# Patient Record
Sex: Female | Born: 1960
Health system: Southern US, Community
[De-identification: ages and names within clinical notes are randomized; demographics above are authoritative.]

## PROBLEM LIST (undated history)

## (undated) DIAGNOSIS — J3089 Other allergic rhinitis: Secondary | ICD-10-CM

## (undated) DIAGNOSIS — Z91018 Allergy to other foods: Secondary | ICD-10-CM

## (undated) DIAGNOSIS — F419 Anxiety disorder, unspecified: Secondary | ICD-10-CM

## (undated) DIAGNOSIS — R7303 Prediabetes: Secondary | ICD-10-CM

## (undated) DIAGNOSIS — I10 Essential (primary) hypertension: Secondary | ICD-10-CM

## (undated) DIAGNOSIS — J45909 Unspecified asthma, uncomplicated: Secondary | ICD-10-CM

## (undated) DIAGNOSIS — S4990XA Unspecified injury of shoulder and upper arm, unspecified arm, initial encounter: Secondary | ICD-10-CM

## (undated) DIAGNOSIS — R609 Edema, unspecified: Secondary | ICD-10-CM

## (undated) DIAGNOSIS — R030 Elevated blood-pressure reading, without diagnosis of hypertension: Secondary | ICD-10-CM

## (undated) DIAGNOSIS — Z9889 Other specified postprocedural states: Secondary | ICD-10-CM

## (undated) DIAGNOSIS — G43909 Migraine, unspecified, not intractable, without status migrainosus: Secondary | ICD-10-CM

## (undated) DIAGNOSIS — D164 Benign neoplasm of bones of skull and face: Secondary | ICD-10-CM

## (undated) DIAGNOSIS — M549 Dorsalgia, unspecified: Secondary | ICD-10-CM

## (undated) DIAGNOSIS — G4730 Sleep apnea, unspecified: Secondary | ICD-10-CM

## (undated) DIAGNOSIS — Z85828 Personal history of other malignant neoplasm of skin: Secondary | ICD-10-CM

## (undated) DIAGNOSIS — Z9109 Other allergy status, other than to drugs and biological substances: Secondary | ICD-10-CM

## (undated) DIAGNOSIS — Z860101 Personal history of adenomatous and serrated colon polyps: Secondary | ICD-10-CM

## (undated) DIAGNOSIS — J309 Allergic rhinitis, unspecified: Secondary | ICD-10-CM

## (undated) DIAGNOSIS — F329 Major depressive disorder, single episode, unspecified: Secondary | ICD-10-CM

## (undated) DIAGNOSIS — R0602 Shortness of breath: Secondary | ICD-10-CM

## (undated) DIAGNOSIS — R5383 Other fatigue: Secondary | ICD-10-CM

## (undated) DIAGNOSIS — J302 Other seasonal allergic rhinitis: Secondary | ICD-10-CM

## (undated) DIAGNOSIS — R112 Nausea with vomiting, unspecified: Secondary | ICD-10-CM

## (undated) DIAGNOSIS — F32A Depression, unspecified: Secondary | ICD-10-CM

## (undated) DIAGNOSIS — K219 Gastro-esophageal reflux disease without esophagitis: Secondary | ICD-10-CM

## (undated) DIAGNOSIS — Z86010 Personal history of colonic polyps: Secondary | ICD-10-CM

## (undated) HISTORY — DX: Unspecified asthma, uncomplicated: J45.909

## (undated) HISTORY — DX: Essential (primary) hypertension: I10

## (undated) HISTORY — DX: Shortness of breath: R06.02

## (undated) HISTORY — DX: Elevated blood-pressure reading, without diagnosis of hypertension: R03.0

## (undated) HISTORY — DX: Other seasonal allergic rhinitis: J30.2

## (undated) HISTORY — PX: ENDOMETRIAL ABLATION: SHX621

## (undated) HISTORY — DX: Depression, unspecified: F32.A

## (undated) HISTORY — DX: Other fatigue: R53.83

## (undated) HISTORY — DX: Gastro-esophageal reflux disease without esophagitis: K21.9

## (undated) HISTORY — PX: FOOT SURGERY: SHX648

## (undated) HISTORY — DX: Unspecified injury of shoulder and upper arm, unspecified arm, initial encounter: S49.90XA

## (undated) HISTORY — PX: OTHER SURGICAL HISTORY: SHX169

## (undated) HISTORY — DX: Anxiety disorder, unspecified: F41.9

## (undated) HISTORY — DX: Prediabetes: R73.03

## (undated) HISTORY — DX: Other allergy status, other than to drugs and biological substances: Z91.09

## (undated) HISTORY — DX: Sleep apnea, unspecified: G47.30

## (undated) HISTORY — DX: Allergy to other foods: Z91.018

## (undated) HISTORY — DX: Dorsalgia, unspecified: M54.9

## (undated) HISTORY — PX: HYSTEROSCOPY W/ ENDOMETRIAL ABLATION: SUR665

## (undated) HISTORY — DX: Edema, unspecified: R60.9

---

## 1998-04-24 ENCOUNTER — Ambulatory Visit (HOSPITAL_COMMUNITY): Admission: RE | Admit: 1998-04-24 | Discharge: 1998-04-24 | Payer: Self-pay | Admitting: *Deleted

## 1998-10-10 HISTORY — PX: CERVICAL CONIZATION W/BX: SHX1330

## 1999-05-05 ENCOUNTER — Other Ambulatory Visit: Admission: RE | Admit: 1999-05-05 | Discharge: 1999-05-05 | Payer: Self-pay | Admitting: *Deleted

## 1999-11-03 ENCOUNTER — Other Ambulatory Visit: Admission: RE | Admit: 1999-11-03 | Discharge: 1999-11-03 | Payer: Self-pay | Admitting: *Deleted

## 2000-06-26 ENCOUNTER — Other Ambulatory Visit: Admission: RE | Admit: 2000-06-26 | Discharge: 2000-06-26 | Payer: Self-pay | Admitting: *Deleted

## 2001-07-03 ENCOUNTER — Other Ambulatory Visit: Admission: RE | Admit: 2001-07-03 | Discharge: 2001-07-03 | Payer: Self-pay | Admitting: *Deleted

## 2002-07-11 ENCOUNTER — Other Ambulatory Visit: Admission: RE | Admit: 2002-07-11 | Discharge: 2002-07-11 | Payer: Self-pay | Admitting: Obstetrics and Gynecology

## 2003-08-13 ENCOUNTER — Other Ambulatory Visit: Admission: RE | Admit: 2003-08-13 | Discharge: 2003-08-13 | Payer: Self-pay | Admitting: *Deleted

## 2004-04-14 ENCOUNTER — Emergency Department (HOSPITAL_COMMUNITY): Admission: EM | Admit: 2004-04-14 | Discharge: 2004-04-14 | Payer: Self-pay | Admitting: Family Medicine

## 2004-08-16 ENCOUNTER — Other Ambulatory Visit: Admission: RE | Admit: 2004-08-16 | Discharge: 2004-08-16 | Payer: Self-pay | Admitting: Obstetrics and Gynecology

## 2005-08-25 ENCOUNTER — Other Ambulatory Visit: Admission: RE | Admit: 2005-08-25 | Discharge: 2005-08-25 | Payer: Self-pay | Admitting: Obstetrics and Gynecology

## 2013-08-16 ENCOUNTER — Other Ambulatory Visit: Payer: Self-pay | Admitting: Allergy and Immunology

## 2013-08-16 ENCOUNTER — Ambulatory Visit
Admission: RE | Admit: 2013-08-16 | Discharge: 2013-08-16 | Disposition: A | Discharge status: Home / Self Care | Payer: 59 | Source: Ambulatory Visit | Attending: Allergy and Immunology | Admitting: Allergy and Immunology

## 2013-08-16 DIAGNOSIS — J45909 Unspecified asthma, uncomplicated: Secondary | ICD-10-CM

## 2016-10-11 DIAGNOSIS — Z01419 Encounter for gynecological examination (general) (routine) without abnormal findings: Secondary | ICD-10-CM | POA: Diagnosis not present

## 2016-10-13 DIAGNOSIS — L820 Inflamed seborrheic keratosis: Secondary | ICD-10-CM | POA: Diagnosis not present

## 2016-10-13 DIAGNOSIS — J301 Allergic rhinitis due to pollen: Secondary | ICD-10-CM | POA: Diagnosis not present

## 2016-10-13 DIAGNOSIS — L821 Other seborrheic keratosis: Secondary | ICD-10-CM | POA: Diagnosis not present

## 2016-10-13 DIAGNOSIS — Z86018 Personal history of other benign neoplasm: Secondary | ICD-10-CM | POA: Diagnosis not present

## 2016-10-13 DIAGNOSIS — J3081 Allergic rhinitis due to animal (cat) (dog) hair and dander: Secondary | ICD-10-CM | POA: Diagnosis not present

## 2016-10-13 DIAGNOSIS — J3089 Other allergic rhinitis: Secondary | ICD-10-CM | POA: Diagnosis not present

## 2016-10-13 DIAGNOSIS — Z85828 Personal history of other malignant neoplasm of skin: Secondary | ICD-10-CM | POA: Diagnosis not present

## 2016-10-18 DIAGNOSIS — J3089 Other allergic rhinitis: Secondary | ICD-10-CM | POA: Diagnosis not present

## 2016-10-18 DIAGNOSIS — J3081 Allergic rhinitis due to animal (cat) (dog) hair and dander: Secondary | ICD-10-CM | POA: Diagnosis not present

## 2016-10-18 DIAGNOSIS — J301 Allergic rhinitis due to pollen: Secondary | ICD-10-CM | POA: Diagnosis not present

## 2016-10-21 DIAGNOSIS — J3081 Allergic rhinitis due to animal (cat) (dog) hair and dander: Secondary | ICD-10-CM | POA: Diagnosis not present

## 2016-10-21 DIAGNOSIS — J3089 Other allergic rhinitis: Secondary | ICD-10-CM | POA: Diagnosis not present

## 2016-10-21 DIAGNOSIS — J301 Allergic rhinitis due to pollen: Secondary | ICD-10-CM | POA: Diagnosis not present

## 2016-10-31 DIAGNOSIS — J301 Allergic rhinitis due to pollen: Secondary | ICD-10-CM | POA: Diagnosis not present

## 2016-10-31 DIAGNOSIS — J3089 Other allergic rhinitis: Secondary | ICD-10-CM | POA: Diagnosis not present

## 2016-10-31 DIAGNOSIS — J3081 Allergic rhinitis due to animal (cat) (dog) hair and dander: Secondary | ICD-10-CM | POA: Diagnosis not present

## 2016-11-09 DIAGNOSIS — J3081 Allergic rhinitis due to animal (cat) (dog) hair and dander: Secondary | ICD-10-CM | POA: Diagnosis not present

## 2016-11-09 DIAGNOSIS — J301 Allergic rhinitis due to pollen: Secondary | ICD-10-CM | POA: Diagnosis not present

## 2016-11-09 DIAGNOSIS — J3089 Other allergic rhinitis: Secondary | ICD-10-CM | POA: Diagnosis not present

## 2016-11-14 DIAGNOSIS — M9903 Segmental and somatic dysfunction of lumbar region: Secondary | ICD-10-CM | POA: Diagnosis not present

## 2016-11-14 DIAGNOSIS — M545 Low back pain: Secondary | ICD-10-CM | POA: Diagnosis not present

## 2016-11-14 DIAGNOSIS — M542 Cervicalgia: Secondary | ICD-10-CM | POA: Diagnosis not present

## 2016-11-16 DIAGNOSIS — M542 Cervicalgia: Secondary | ICD-10-CM | POA: Diagnosis not present

## 2016-11-16 DIAGNOSIS — M545 Low back pain: Secondary | ICD-10-CM | POA: Diagnosis not present

## 2016-11-16 DIAGNOSIS — M9903 Segmental and somatic dysfunction of lumbar region: Secondary | ICD-10-CM | POA: Diagnosis not present

## 2016-11-23 DIAGNOSIS — J3081 Allergic rhinitis due to animal (cat) (dog) hair and dander: Secondary | ICD-10-CM | POA: Diagnosis not present

## 2016-11-23 DIAGNOSIS — J301 Allergic rhinitis due to pollen: Secondary | ICD-10-CM | POA: Diagnosis not present

## 2016-11-23 DIAGNOSIS — J3089 Other allergic rhinitis: Secondary | ICD-10-CM | POA: Diagnosis not present

## 2016-11-29 DIAGNOSIS — J3081 Allergic rhinitis due to animal (cat) (dog) hair and dander: Secondary | ICD-10-CM | POA: Diagnosis not present

## 2016-11-29 DIAGNOSIS — J301 Allergic rhinitis due to pollen: Secondary | ICD-10-CM | POA: Diagnosis not present

## 2016-11-29 DIAGNOSIS — J3089 Other allergic rhinitis: Secondary | ICD-10-CM | POA: Diagnosis not present

## 2016-12-07 DIAGNOSIS — J301 Allergic rhinitis due to pollen: Secondary | ICD-10-CM | POA: Diagnosis not present

## 2016-12-07 DIAGNOSIS — J3089 Other allergic rhinitis: Secondary | ICD-10-CM | POA: Diagnosis not present

## 2016-12-07 DIAGNOSIS — J3081 Allergic rhinitis due to animal (cat) (dog) hair and dander: Secondary | ICD-10-CM | POA: Diagnosis not present

## 2016-12-12 DIAGNOSIS — J3089 Other allergic rhinitis: Secondary | ICD-10-CM | POA: Diagnosis not present

## 2016-12-14 DIAGNOSIS — J301 Allergic rhinitis due to pollen: Secondary | ICD-10-CM | POA: Diagnosis not present

## 2016-12-14 DIAGNOSIS — J3081 Allergic rhinitis due to animal (cat) (dog) hair and dander: Secondary | ICD-10-CM | POA: Diagnosis not present

## 2016-12-14 DIAGNOSIS — J3089 Other allergic rhinitis: Secondary | ICD-10-CM | POA: Diagnosis not present

## 2016-12-21 DIAGNOSIS — J3081 Allergic rhinitis due to animal (cat) (dog) hair and dander: Secondary | ICD-10-CM | POA: Diagnosis not present

## 2016-12-21 DIAGNOSIS — J3089 Other allergic rhinitis: Secondary | ICD-10-CM | POA: Diagnosis not present

## 2016-12-21 DIAGNOSIS — J301 Allergic rhinitis due to pollen: Secondary | ICD-10-CM | POA: Diagnosis not present

## 2016-12-30 DIAGNOSIS — J3081 Allergic rhinitis due to animal (cat) (dog) hair and dander: Secondary | ICD-10-CM | POA: Diagnosis not present

## 2016-12-30 DIAGNOSIS — J3089 Other allergic rhinitis: Secondary | ICD-10-CM | POA: Diagnosis not present

## 2016-12-30 DIAGNOSIS — J301 Allergic rhinitis due to pollen: Secondary | ICD-10-CM | POA: Diagnosis not present

## 2017-01-04 DIAGNOSIS — J301 Allergic rhinitis due to pollen: Secondary | ICD-10-CM | POA: Diagnosis not present

## 2017-01-04 DIAGNOSIS — J3081 Allergic rhinitis due to animal (cat) (dog) hair and dander: Secondary | ICD-10-CM | POA: Diagnosis not present

## 2017-01-05 DIAGNOSIS — J3081 Allergic rhinitis due to animal (cat) (dog) hair and dander: Secondary | ICD-10-CM | POA: Diagnosis not present

## 2017-01-05 DIAGNOSIS — J3089 Other allergic rhinitis: Secondary | ICD-10-CM | POA: Diagnosis not present

## 2017-01-05 DIAGNOSIS — J301 Allergic rhinitis due to pollen: Secondary | ICD-10-CM | POA: Diagnosis not present

## 2017-01-11 DIAGNOSIS — J301 Allergic rhinitis due to pollen: Secondary | ICD-10-CM | POA: Diagnosis not present

## 2017-01-11 DIAGNOSIS — J3081 Allergic rhinitis due to animal (cat) (dog) hair and dander: Secondary | ICD-10-CM | POA: Diagnosis not present

## 2017-01-11 DIAGNOSIS — J3089 Other allergic rhinitis: Secondary | ICD-10-CM | POA: Diagnosis not present

## 2017-01-17 DIAGNOSIS — J301 Allergic rhinitis due to pollen: Secondary | ICD-10-CM | POA: Diagnosis not present

## 2017-01-17 DIAGNOSIS — J3089 Other allergic rhinitis: Secondary | ICD-10-CM | POA: Diagnosis not present

## 2017-01-17 DIAGNOSIS — J3081 Allergic rhinitis due to animal (cat) (dog) hair and dander: Secondary | ICD-10-CM | POA: Diagnosis not present

## 2017-01-27 DIAGNOSIS — J301 Allergic rhinitis due to pollen: Secondary | ICD-10-CM | POA: Diagnosis not present

## 2017-01-27 DIAGNOSIS — J3081 Allergic rhinitis due to animal (cat) (dog) hair and dander: Secondary | ICD-10-CM | POA: Diagnosis not present

## 2017-01-27 DIAGNOSIS — J3089 Other allergic rhinitis: Secondary | ICD-10-CM | POA: Diagnosis not present

## 2017-02-01 DIAGNOSIS — J3081 Allergic rhinitis due to animal (cat) (dog) hair and dander: Secondary | ICD-10-CM | POA: Diagnosis not present

## 2017-02-01 DIAGNOSIS — J301 Allergic rhinitis due to pollen: Secondary | ICD-10-CM | POA: Diagnosis not present

## 2017-02-01 DIAGNOSIS — J3089 Other allergic rhinitis: Secondary | ICD-10-CM | POA: Diagnosis not present

## 2017-02-06 DIAGNOSIS — Z1231 Encounter for screening mammogram for malignant neoplasm of breast: Secondary | ICD-10-CM | POA: Diagnosis not present

## 2017-02-08 DIAGNOSIS — J3089 Other allergic rhinitis: Secondary | ICD-10-CM | POA: Diagnosis not present

## 2017-02-08 DIAGNOSIS — J3081 Allergic rhinitis due to animal (cat) (dog) hair and dander: Secondary | ICD-10-CM | POA: Diagnosis not present

## 2017-02-08 DIAGNOSIS — J301 Allergic rhinitis due to pollen: Secondary | ICD-10-CM | POA: Diagnosis not present

## 2017-02-17 DIAGNOSIS — J301 Allergic rhinitis due to pollen: Secondary | ICD-10-CM | POA: Diagnosis not present

## 2017-02-17 DIAGNOSIS — J3081 Allergic rhinitis due to animal (cat) (dog) hair and dander: Secondary | ICD-10-CM | POA: Diagnosis not present

## 2017-02-17 DIAGNOSIS — J3089 Other allergic rhinitis: Secondary | ICD-10-CM | POA: Diagnosis not present

## 2017-02-24 DIAGNOSIS — J3089 Other allergic rhinitis: Secondary | ICD-10-CM | POA: Diagnosis not present

## 2017-02-24 DIAGNOSIS — J301 Allergic rhinitis due to pollen: Secondary | ICD-10-CM | POA: Diagnosis not present

## 2017-02-24 DIAGNOSIS — J3081 Allergic rhinitis due to animal (cat) (dog) hair and dander: Secondary | ICD-10-CM | POA: Diagnosis not present

## 2017-02-28 DIAGNOSIS — M1612 Unilateral primary osteoarthritis, left hip: Secondary | ICD-10-CM | POA: Diagnosis not present

## 2017-02-28 DIAGNOSIS — Z01812 Encounter for preprocedural laboratory examination: Secondary | ICD-10-CM | POA: Diagnosis not present

## 2017-02-28 DIAGNOSIS — Z01818 Encounter for other preprocedural examination: Secondary | ICD-10-CM | POA: Diagnosis not present

## 2017-03-01 DIAGNOSIS — J3089 Other allergic rhinitis: Secondary | ICD-10-CM | POA: Diagnosis not present

## 2017-03-01 DIAGNOSIS — J301 Allergic rhinitis due to pollen: Secondary | ICD-10-CM | POA: Diagnosis not present

## 2017-03-01 DIAGNOSIS — J3081 Allergic rhinitis due to animal (cat) (dog) hair and dander: Secondary | ICD-10-CM | POA: Diagnosis not present

## 2017-03-09 DIAGNOSIS — J3081 Allergic rhinitis due to animal (cat) (dog) hair and dander: Secondary | ICD-10-CM | POA: Diagnosis not present

## 2017-03-09 DIAGNOSIS — J3089 Other allergic rhinitis: Secondary | ICD-10-CM | POA: Diagnosis not present

## 2017-03-09 DIAGNOSIS — J301 Allergic rhinitis due to pollen: Secondary | ICD-10-CM | POA: Diagnosis not present

## 2017-03-13 DIAGNOSIS — M546 Pain in thoracic spine: Secondary | ICD-10-CM | POA: Diagnosis not present

## 2017-03-13 DIAGNOSIS — M542 Cervicalgia: Secondary | ICD-10-CM | POA: Diagnosis not present

## 2017-03-13 DIAGNOSIS — M545 Low back pain: Secondary | ICD-10-CM | POA: Diagnosis not present

## 2017-03-15 DIAGNOSIS — J301 Allergic rhinitis due to pollen: Secondary | ICD-10-CM | POA: Diagnosis not present

## 2017-03-15 DIAGNOSIS — J3081 Allergic rhinitis due to animal (cat) (dog) hair and dander: Secondary | ICD-10-CM | POA: Diagnosis not present

## 2017-03-15 DIAGNOSIS — J3089 Other allergic rhinitis: Secondary | ICD-10-CM | POA: Diagnosis not present

## 2017-03-21 DIAGNOSIS — J3089 Other allergic rhinitis: Secondary | ICD-10-CM | POA: Diagnosis not present

## 2017-03-21 DIAGNOSIS — J3081 Allergic rhinitis due to animal (cat) (dog) hair and dander: Secondary | ICD-10-CM | POA: Diagnosis not present

## 2017-03-21 DIAGNOSIS — J301 Allergic rhinitis due to pollen: Secondary | ICD-10-CM | POA: Diagnosis not present

## 2017-03-28 DIAGNOSIS — J3089 Other allergic rhinitis: Secondary | ICD-10-CM | POA: Diagnosis not present

## 2017-03-28 DIAGNOSIS — J301 Allergic rhinitis due to pollen: Secondary | ICD-10-CM | POA: Diagnosis not present

## 2017-04-03 DIAGNOSIS — J301 Allergic rhinitis due to pollen: Secondary | ICD-10-CM | POA: Diagnosis not present

## 2017-04-03 DIAGNOSIS — J3081 Allergic rhinitis due to animal (cat) (dog) hair and dander: Secondary | ICD-10-CM | POA: Diagnosis not present

## 2017-04-03 DIAGNOSIS — J3089 Other allergic rhinitis: Secondary | ICD-10-CM | POA: Diagnosis not present

## 2017-04-06 DIAGNOSIS — M546 Pain in thoracic spine: Secondary | ICD-10-CM | POA: Diagnosis not present

## 2017-04-06 DIAGNOSIS — M542 Cervicalgia: Secondary | ICD-10-CM | POA: Diagnosis not present

## 2017-04-06 DIAGNOSIS — M545 Low back pain: Secondary | ICD-10-CM | POA: Diagnosis not present

## 2017-04-11 DIAGNOSIS — J3081 Allergic rhinitis due to animal (cat) (dog) hair and dander: Secondary | ICD-10-CM | POA: Diagnosis not present

## 2017-04-11 DIAGNOSIS — J3089 Other allergic rhinitis: Secondary | ICD-10-CM | POA: Diagnosis not present

## 2017-04-11 DIAGNOSIS — J301 Allergic rhinitis due to pollen: Secondary | ICD-10-CM | POA: Diagnosis not present

## 2017-04-20 DIAGNOSIS — J301 Allergic rhinitis due to pollen: Secondary | ICD-10-CM | POA: Diagnosis not present

## 2017-04-20 DIAGNOSIS — J3081 Allergic rhinitis due to animal (cat) (dog) hair and dander: Secondary | ICD-10-CM | POA: Diagnosis not present

## 2017-04-20 DIAGNOSIS — J3089 Other allergic rhinitis: Secondary | ICD-10-CM | POA: Diagnosis not present

## 2017-04-25 DIAGNOSIS — M542 Cervicalgia: Secondary | ICD-10-CM | POA: Diagnosis not present

## 2017-04-25 DIAGNOSIS — M546 Pain in thoracic spine: Secondary | ICD-10-CM | POA: Diagnosis not present

## 2017-04-25 DIAGNOSIS — M545 Low back pain: Secondary | ICD-10-CM | POA: Diagnosis not present

## 2017-04-27 DIAGNOSIS — J3081 Allergic rhinitis due to animal (cat) (dog) hair and dander: Secondary | ICD-10-CM | POA: Diagnosis not present

## 2017-04-27 DIAGNOSIS — J3089 Other allergic rhinitis: Secondary | ICD-10-CM | POA: Diagnosis not present

## 2017-04-27 DIAGNOSIS — J301 Allergic rhinitis due to pollen: Secondary | ICD-10-CM | POA: Diagnosis not present

## 2017-05-01 DIAGNOSIS — M546 Pain in thoracic spine: Secondary | ICD-10-CM | POA: Diagnosis not present

## 2017-05-01 DIAGNOSIS — M545 Low back pain: Secondary | ICD-10-CM | POA: Diagnosis not present

## 2017-05-01 DIAGNOSIS — M542 Cervicalgia: Secondary | ICD-10-CM | POA: Diagnosis not present

## 2017-05-03 DIAGNOSIS — J3081 Allergic rhinitis due to animal (cat) (dog) hair and dander: Secondary | ICD-10-CM | POA: Diagnosis not present

## 2017-05-03 DIAGNOSIS — J3089 Other allergic rhinitis: Secondary | ICD-10-CM | POA: Diagnosis not present

## 2017-05-03 DIAGNOSIS — J301 Allergic rhinitis due to pollen: Secondary | ICD-10-CM | POA: Diagnosis not present

## 2017-05-10 DIAGNOSIS — J3089 Other allergic rhinitis: Secondary | ICD-10-CM | POA: Diagnosis not present

## 2017-05-10 DIAGNOSIS — J301 Allergic rhinitis due to pollen: Secondary | ICD-10-CM | POA: Diagnosis not present

## 2017-05-10 DIAGNOSIS — J3081 Allergic rhinitis due to animal (cat) (dog) hair and dander: Secondary | ICD-10-CM | POA: Diagnosis not present

## 2017-05-17 DIAGNOSIS — J301 Allergic rhinitis due to pollen: Secondary | ICD-10-CM | POA: Diagnosis not present

## 2017-05-17 DIAGNOSIS — J3089 Other allergic rhinitis: Secondary | ICD-10-CM | POA: Diagnosis not present

## 2017-05-17 DIAGNOSIS — J3081 Allergic rhinitis due to animal (cat) (dog) hair and dander: Secondary | ICD-10-CM | POA: Diagnosis not present

## 2017-05-23 DIAGNOSIS — J3089 Other allergic rhinitis: Secondary | ICD-10-CM | POA: Diagnosis not present

## 2017-05-23 DIAGNOSIS — J301 Allergic rhinitis due to pollen: Secondary | ICD-10-CM | POA: Diagnosis not present

## 2017-05-23 DIAGNOSIS — J3081 Allergic rhinitis due to animal (cat) (dog) hair and dander: Secondary | ICD-10-CM | POA: Diagnosis not present

## 2017-05-26 DIAGNOSIS — J3089 Other allergic rhinitis: Secondary | ICD-10-CM | POA: Diagnosis not present

## 2017-05-30 DIAGNOSIS — J3089 Other allergic rhinitis: Secondary | ICD-10-CM | POA: Diagnosis not present

## 2017-05-30 DIAGNOSIS — J301 Allergic rhinitis due to pollen: Secondary | ICD-10-CM | POA: Diagnosis not present

## 2017-05-30 DIAGNOSIS — J3081 Allergic rhinitis due to animal (cat) (dog) hair and dander: Secondary | ICD-10-CM | POA: Diagnosis not present

## 2017-06-07 DIAGNOSIS — J301 Allergic rhinitis due to pollen: Secondary | ICD-10-CM | POA: Diagnosis not present

## 2017-06-07 DIAGNOSIS — J3089 Other allergic rhinitis: Secondary | ICD-10-CM | POA: Diagnosis not present

## 2017-06-07 DIAGNOSIS — M542 Cervicalgia: Secondary | ICD-10-CM | POA: Diagnosis not present

## 2017-06-07 DIAGNOSIS — M546 Pain in thoracic spine: Secondary | ICD-10-CM | POA: Diagnosis not present

## 2017-06-07 DIAGNOSIS — J3081 Allergic rhinitis due to animal (cat) (dog) hair and dander: Secondary | ICD-10-CM | POA: Diagnosis not present

## 2017-06-07 DIAGNOSIS — M545 Low back pain: Secondary | ICD-10-CM | POA: Diagnosis not present

## 2017-06-14 DIAGNOSIS — J3089 Other allergic rhinitis: Secondary | ICD-10-CM | POA: Diagnosis not present

## 2017-06-14 DIAGNOSIS — J3081 Allergic rhinitis due to animal (cat) (dog) hair and dander: Secondary | ICD-10-CM | POA: Diagnosis not present

## 2017-06-14 DIAGNOSIS — J301 Allergic rhinitis due to pollen: Secondary | ICD-10-CM | POA: Diagnosis not present

## 2017-06-16 DIAGNOSIS — Z23 Encounter for immunization: Secondary | ICD-10-CM | POA: Diagnosis not present

## 2017-06-26 DIAGNOSIS — J3089 Other allergic rhinitis: Secondary | ICD-10-CM | POA: Diagnosis not present

## 2017-06-26 DIAGNOSIS — J301 Allergic rhinitis due to pollen: Secondary | ICD-10-CM | POA: Diagnosis not present

## 2017-06-26 DIAGNOSIS — J3081 Allergic rhinitis due to animal (cat) (dog) hair and dander: Secondary | ICD-10-CM | POA: Diagnosis not present

## 2017-06-29 DIAGNOSIS — J301 Allergic rhinitis due to pollen: Secondary | ICD-10-CM | POA: Diagnosis not present

## 2017-06-29 DIAGNOSIS — J3081 Allergic rhinitis due to animal (cat) (dog) hair and dander: Secondary | ICD-10-CM | POA: Diagnosis not present

## 2017-07-03 DIAGNOSIS — M546 Pain in thoracic spine: Secondary | ICD-10-CM | POA: Diagnosis not present

## 2017-07-03 DIAGNOSIS — M545 Low back pain: Secondary | ICD-10-CM | POA: Diagnosis not present

## 2017-07-03 DIAGNOSIS — M542 Cervicalgia: Secondary | ICD-10-CM | POA: Diagnosis not present

## 2017-07-04 DIAGNOSIS — J3081 Allergic rhinitis due to animal (cat) (dog) hair and dander: Secondary | ICD-10-CM | POA: Diagnosis not present

## 2017-07-04 DIAGNOSIS — J3089 Other allergic rhinitis: Secondary | ICD-10-CM | POA: Diagnosis not present

## 2017-07-04 DIAGNOSIS — J301 Allergic rhinitis due to pollen: Secondary | ICD-10-CM | POA: Diagnosis not present

## 2017-07-04 DIAGNOSIS — Z23 Encounter for immunization: Secondary | ICD-10-CM | POA: Diagnosis not present

## 2017-07-13 DIAGNOSIS — J301 Allergic rhinitis due to pollen: Secondary | ICD-10-CM | POA: Diagnosis not present

## 2017-07-13 DIAGNOSIS — J3081 Allergic rhinitis due to animal (cat) (dog) hair and dander: Secondary | ICD-10-CM | POA: Diagnosis not present

## 2017-07-13 DIAGNOSIS — J3089 Other allergic rhinitis: Secondary | ICD-10-CM | POA: Diagnosis not present

## 2017-07-28 DIAGNOSIS — J301 Allergic rhinitis due to pollen: Secondary | ICD-10-CM | POA: Diagnosis not present

## 2017-07-28 DIAGNOSIS — J3081 Allergic rhinitis due to animal (cat) (dog) hair and dander: Secondary | ICD-10-CM | POA: Diagnosis not present

## 2017-07-28 DIAGNOSIS — J3089 Other allergic rhinitis: Secondary | ICD-10-CM | POA: Diagnosis not present

## 2017-08-01 DIAGNOSIS — J301 Allergic rhinitis due to pollen: Secondary | ICD-10-CM | POA: Diagnosis not present

## 2017-08-01 DIAGNOSIS — J3089 Other allergic rhinitis: Secondary | ICD-10-CM | POA: Diagnosis not present

## 2017-08-01 DIAGNOSIS — J3081 Allergic rhinitis due to animal (cat) (dog) hair and dander: Secondary | ICD-10-CM | POA: Diagnosis not present

## 2017-08-04 DIAGNOSIS — J3089 Other allergic rhinitis: Secondary | ICD-10-CM | POA: Diagnosis not present

## 2017-08-04 DIAGNOSIS — J3081 Allergic rhinitis due to animal (cat) (dog) hair and dander: Secondary | ICD-10-CM | POA: Diagnosis not present

## 2017-08-04 DIAGNOSIS — J301 Allergic rhinitis due to pollen: Secondary | ICD-10-CM | POA: Diagnosis not present

## 2017-08-07 DIAGNOSIS — M9901 Segmental and somatic dysfunction of cervical region: Secondary | ICD-10-CM | POA: Diagnosis not present

## 2017-08-07 DIAGNOSIS — M546 Pain in thoracic spine: Secondary | ICD-10-CM | POA: Diagnosis not present

## 2017-08-07 DIAGNOSIS — M542 Cervicalgia: Secondary | ICD-10-CM | POA: Diagnosis not present

## 2017-08-08 DIAGNOSIS — J3089 Other allergic rhinitis: Secondary | ICD-10-CM | POA: Diagnosis not present

## 2017-08-08 DIAGNOSIS — J301 Allergic rhinitis due to pollen: Secondary | ICD-10-CM | POA: Diagnosis not present

## 2017-08-08 DIAGNOSIS — J3081 Allergic rhinitis due to animal (cat) (dog) hair and dander: Secondary | ICD-10-CM | POA: Diagnosis not present

## 2017-08-11 DIAGNOSIS — J301 Allergic rhinitis due to pollen: Secondary | ICD-10-CM | POA: Diagnosis not present

## 2017-08-11 DIAGNOSIS — J3081 Allergic rhinitis due to animal (cat) (dog) hair and dander: Secondary | ICD-10-CM | POA: Diagnosis not present

## 2017-08-11 DIAGNOSIS — J3089 Other allergic rhinitis: Secondary | ICD-10-CM | POA: Diagnosis not present

## 2017-08-16 DIAGNOSIS — J301 Allergic rhinitis due to pollen: Secondary | ICD-10-CM | POA: Diagnosis not present

## 2017-08-16 DIAGNOSIS — J3089 Other allergic rhinitis: Secondary | ICD-10-CM | POA: Diagnosis not present

## 2017-08-16 DIAGNOSIS — J3081 Allergic rhinitis due to animal (cat) (dog) hair and dander: Secondary | ICD-10-CM | POA: Diagnosis not present

## 2017-08-22 DIAGNOSIS — I10 Essential (primary) hypertension: Secondary | ICD-10-CM | POA: Diagnosis not present

## 2017-08-22 DIAGNOSIS — R7303 Prediabetes: Secondary | ICD-10-CM | POA: Diagnosis not present

## 2017-08-23 DIAGNOSIS — Z0100 Encounter for examination of eyes and vision without abnormal findings: Secondary | ICD-10-CM | POA: Diagnosis not present

## 2017-08-23 DIAGNOSIS — J301 Allergic rhinitis due to pollen: Secondary | ICD-10-CM | POA: Diagnosis not present

## 2017-08-23 DIAGNOSIS — J3089 Other allergic rhinitis: Secondary | ICD-10-CM | POA: Diagnosis not present

## 2017-08-23 DIAGNOSIS — J3081 Allergic rhinitis due to animal (cat) (dog) hair and dander: Secondary | ICD-10-CM | POA: Diagnosis not present

## 2017-08-29 DIAGNOSIS — J301 Allergic rhinitis due to pollen: Secondary | ICD-10-CM | POA: Diagnosis not present

## 2017-08-29 DIAGNOSIS — J3089 Other allergic rhinitis: Secondary | ICD-10-CM | POA: Diagnosis not present

## 2017-08-29 DIAGNOSIS — J3081 Allergic rhinitis due to animal (cat) (dog) hair and dander: Secondary | ICD-10-CM | POA: Diagnosis not present

## 2017-09-06 DIAGNOSIS — J301 Allergic rhinitis due to pollen: Secondary | ICD-10-CM | POA: Diagnosis not present

## 2017-09-06 DIAGNOSIS — J3081 Allergic rhinitis due to animal (cat) (dog) hair and dander: Secondary | ICD-10-CM | POA: Diagnosis not present

## 2017-09-06 DIAGNOSIS — J3089 Other allergic rhinitis: Secondary | ICD-10-CM | POA: Diagnosis not present

## 2017-09-12 DIAGNOSIS — J3081 Allergic rhinitis due to animal (cat) (dog) hair and dander: Secondary | ICD-10-CM | POA: Diagnosis not present

## 2017-09-12 DIAGNOSIS — J3089 Other allergic rhinitis: Secondary | ICD-10-CM | POA: Diagnosis not present

## 2017-09-12 DIAGNOSIS — J301 Allergic rhinitis due to pollen: Secondary | ICD-10-CM | POA: Diagnosis not present

## 2017-09-20 DIAGNOSIS — J301 Allergic rhinitis due to pollen: Secondary | ICD-10-CM | POA: Diagnosis not present

## 2017-09-20 DIAGNOSIS — J3081 Allergic rhinitis due to animal (cat) (dog) hair and dander: Secondary | ICD-10-CM | POA: Diagnosis not present

## 2017-09-20 DIAGNOSIS — J3089 Other allergic rhinitis: Secondary | ICD-10-CM | POA: Diagnosis not present

## 2017-09-29 DIAGNOSIS — J3081 Allergic rhinitis due to animal (cat) (dog) hair and dander: Secondary | ICD-10-CM | POA: Diagnosis not present

## 2017-09-29 DIAGNOSIS — J301 Allergic rhinitis due to pollen: Secondary | ICD-10-CM | POA: Diagnosis not present

## 2017-09-29 DIAGNOSIS — J3089 Other allergic rhinitis: Secondary | ICD-10-CM | POA: Diagnosis not present

## 2017-10-05 DIAGNOSIS — J3089 Other allergic rhinitis: Secondary | ICD-10-CM | POA: Diagnosis not present

## 2017-10-05 DIAGNOSIS — J3081 Allergic rhinitis due to animal (cat) (dog) hair and dander: Secondary | ICD-10-CM | POA: Diagnosis not present

## 2017-10-05 DIAGNOSIS — J301 Allergic rhinitis due to pollen: Secondary | ICD-10-CM | POA: Diagnosis not present

## 2017-10-10 HISTORY — PX: FOOT SURGERY: SHX648

## 2017-10-11 DIAGNOSIS — Z86018 Personal history of other benign neoplasm: Secondary | ICD-10-CM | POA: Diagnosis not present

## 2017-10-11 DIAGNOSIS — L820 Inflamed seborrheic keratosis: Secondary | ICD-10-CM | POA: Diagnosis not present

## 2017-10-11 DIAGNOSIS — Z85828 Personal history of other malignant neoplasm of skin: Secondary | ICD-10-CM | POA: Diagnosis not present

## 2017-10-13 DIAGNOSIS — J301 Allergic rhinitis due to pollen: Secondary | ICD-10-CM | POA: Diagnosis not present

## 2017-10-13 DIAGNOSIS — J3081 Allergic rhinitis due to animal (cat) (dog) hair and dander: Secondary | ICD-10-CM | POA: Diagnosis not present

## 2017-10-13 DIAGNOSIS — J3089 Other allergic rhinitis: Secondary | ICD-10-CM | POA: Diagnosis not present

## 2017-10-18 DIAGNOSIS — J3081 Allergic rhinitis due to animal (cat) (dog) hair and dander: Secondary | ICD-10-CM | POA: Diagnosis not present

## 2017-10-18 DIAGNOSIS — J3089 Other allergic rhinitis: Secondary | ICD-10-CM | POA: Diagnosis not present

## 2017-10-18 DIAGNOSIS — J301 Allergic rhinitis due to pollen: Secondary | ICD-10-CM | POA: Diagnosis not present

## 2017-10-23 DIAGNOSIS — J3081 Allergic rhinitis due to animal (cat) (dog) hair and dander: Secondary | ICD-10-CM | POA: Diagnosis not present

## 2017-10-23 DIAGNOSIS — J3089 Other allergic rhinitis: Secondary | ICD-10-CM | POA: Diagnosis not present

## 2017-10-23 DIAGNOSIS — J301 Allergic rhinitis due to pollen: Secondary | ICD-10-CM | POA: Diagnosis not present

## 2017-10-31 DIAGNOSIS — J3089 Other allergic rhinitis: Secondary | ICD-10-CM | POA: Diagnosis not present

## 2017-10-31 DIAGNOSIS — J301 Allergic rhinitis due to pollen: Secondary | ICD-10-CM | POA: Diagnosis not present

## 2017-11-10 DIAGNOSIS — J301 Allergic rhinitis due to pollen: Secondary | ICD-10-CM | POA: Diagnosis not present

## 2017-11-10 DIAGNOSIS — J4540 Moderate persistent asthma, uncomplicated: Secondary | ICD-10-CM | POA: Diagnosis not present

## 2017-11-10 DIAGNOSIS — J3081 Allergic rhinitis due to animal (cat) (dog) hair and dander: Secondary | ICD-10-CM | POA: Diagnosis not present

## 2017-11-10 DIAGNOSIS — J3089 Other allergic rhinitis: Secondary | ICD-10-CM | POA: Diagnosis not present

## 2017-11-17 DIAGNOSIS — J3089 Other allergic rhinitis: Secondary | ICD-10-CM | POA: Diagnosis not present

## 2017-11-17 DIAGNOSIS — J3081 Allergic rhinitis due to animal (cat) (dog) hair and dander: Secondary | ICD-10-CM | POA: Diagnosis not present

## 2017-11-17 DIAGNOSIS — J301 Allergic rhinitis due to pollen: Secondary | ICD-10-CM | POA: Diagnosis not present

## 2017-11-22 DIAGNOSIS — J3089 Other allergic rhinitis: Secondary | ICD-10-CM | POA: Diagnosis not present

## 2017-11-22 DIAGNOSIS — J3081 Allergic rhinitis due to animal (cat) (dog) hair and dander: Secondary | ICD-10-CM | POA: Diagnosis not present

## 2017-11-22 DIAGNOSIS — J301 Allergic rhinitis due to pollen: Secondary | ICD-10-CM | POA: Diagnosis not present

## 2017-11-27 DIAGNOSIS — J301 Allergic rhinitis due to pollen: Secondary | ICD-10-CM | POA: Diagnosis not present

## 2017-11-27 DIAGNOSIS — J3081 Allergic rhinitis due to animal (cat) (dog) hair and dander: Secondary | ICD-10-CM | POA: Diagnosis not present

## 2017-11-28 DIAGNOSIS — J301 Allergic rhinitis due to pollen: Secondary | ICD-10-CM | POA: Diagnosis not present

## 2017-11-28 DIAGNOSIS — J3081 Allergic rhinitis due to animal (cat) (dog) hair and dander: Secondary | ICD-10-CM | POA: Diagnosis not present

## 2017-11-28 DIAGNOSIS — J3089 Other allergic rhinitis: Secondary | ICD-10-CM | POA: Diagnosis not present

## 2017-12-06 DIAGNOSIS — J301 Allergic rhinitis due to pollen: Secondary | ICD-10-CM | POA: Diagnosis not present

## 2017-12-06 DIAGNOSIS — J3089 Other allergic rhinitis: Secondary | ICD-10-CM | POA: Diagnosis not present

## 2017-12-06 DIAGNOSIS — J3081 Allergic rhinitis due to animal (cat) (dog) hair and dander: Secondary | ICD-10-CM | POA: Diagnosis not present

## 2017-12-13 DIAGNOSIS — J3089 Other allergic rhinitis: Secondary | ICD-10-CM | POA: Diagnosis not present

## 2017-12-13 DIAGNOSIS — J301 Allergic rhinitis due to pollen: Secondary | ICD-10-CM | POA: Diagnosis not present

## 2017-12-13 DIAGNOSIS — J3081 Allergic rhinitis due to animal (cat) (dog) hair and dander: Secondary | ICD-10-CM | POA: Diagnosis not present

## 2017-12-18 DIAGNOSIS — J301 Allergic rhinitis due to pollen: Secondary | ICD-10-CM | POA: Diagnosis not present

## 2017-12-18 DIAGNOSIS — J3081 Allergic rhinitis due to animal (cat) (dog) hair and dander: Secondary | ICD-10-CM | POA: Diagnosis not present

## 2017-12-18 DIAGNOSIS — J3089 Other allergic rhinitis: Secondary | ICD-10-CM | POA: Diagnosis not present

## 2017-12-21 DIAGNOSIS — J3081 Allergic rhinitis due to animal (cat) (dog) hair and dander: Secondary | ICD-10-CM | POA: Diagnosis not present

## 2017-12-21 DIAGNOSIS — J3089 Other allergic rhinitis: Secondary | ICD-10-CM | POA: Diagnosis not present

## 2017-12-21 DIAGNOSIS — J301 Allergic rhinitis due to pollen: Secondary | ICD-10-CM | POA: Diagnosis not present

## 2017-12-27 DIAGNOSIS — J3081 Allergic rhinitis due to animal (cat) (dog) hair and dander: Secondary | ICD-10-CM | POA: Diagnosis not present

## 2017-12-27 DIAGNOSIS — J301 Allergic rhinitis due to pollen: Secondary | ICD-10-CM | POA: Diagnosis not present

## 2017-12-27 DIAGNOSIS — Z6835 Body mass index (BMI) 35.0-35.9, adult: Secondary | ICD-10-CM | POA: Diagnosis not present

## 2017-12-27 DIAGNOSIS — J3089 Other allergic rhinitis: Secondary | ICD-10-CM | POA: Diagnosis not present

## 2017-12-27 DIAGNOSIS — Z01419 Encounter for gynecological examination (general) (routine) without abnormal findings: Secondary | ICD-10-CM | POA: Diagnosis not present

## 2018-01-09 DIAGNOSIS — J3089 Other allergic rhinitis: Secondary | ICD-10-CM | POA: Diagnosis not present

## 2018-01-09 DIAGNOSIS — J3081 Allergic rhinitis due to animal (cat) (dog) hair and dander: Secondary | ICD-10-CM | POA: Diagnosis not present

## 2018-01-09 DIAGNOSIS — J301 Allergic rhinitis due to pollen: Secondary | ICD-10-CM | POA: Diagnosis not present

## 2018-01-12 DIAGNOSIS — J301 Allergic rhinitis due to pollen: Secondary | ICD-10-CM | POA: Diagnosis not present

## 2018-01-12 DIAGNOSIS — J3081 Allergic rhinitis due to animal (cat) (dog) hair and dander: Secondary | ICD-10-CM | POA: Diagnosis not present

## 2018-01-12 DIAGNOSIS — J3089 Other allergic rhinitis: Secondary | ICD-10-CM | POA: Diagnosis not present

## 2018-01-15 DIAGNOSIS — J3089 Other allergic rhinitis: Secondary | ICD-10-CM | POA: Diagnosis not present

## 2018-01-15 DIAGNOSIS — J3081 Allergic rhinitis due to animal (cat) (dog) hair and dander: Secondary | ICD-10-CM | POA: Diagnosis not present

## 2018-01-15 DIAGNOSIS — J301 Allergic rhinitis due to pollen: Secondary | ICD-10-CM | POA: Diagnosis not present

## 2018-01-16 DIAGNOSIS — R87618 Other abnormal cytological findings on specimens from cervix uteri: Secondary | ICD-10-CM | POA: Diagnosis not present

## 2018-01-19 DIAGNOSIS — J3081 Allergic rhinitis due to animal (cat) (dog) hair and dander: Secondary | ICD-10-CM | POA: Diagnosis not present

## 2018-01-19 DIAGNOSIS — J301 Allergic rhinitis due to pollen: Secondary | ICD-10-CM | POA: Diagnosis not present

## 2018-01-24 DIAGNOSIS — J301 Allergic rhinitis due to pollen: Secondary | ICD-10-CM | POA: Diagnosis not present

## 2018-01-24 DIAGNOSIS — J3081 Allergic rhinitis due to animal (cat) (dog) hair and dander: Secondary | ICD-10-CM | POA: Diagnosis not present

## 2018-01-24 DIAGNOSIS — J3089 Other allergic rhinitis: Secondary | ICD-10-CM | POA: Diagnosis not present

## 2018-01-31 DIAGNOSIS — J301 Allergic rhinitis due to pollen: Secondary | ICD-10-CM | POA: Diagnosis not present

## 2018-01-31 DIAGNOSIS — J3089 Other allergic rhinitis: Secondary | ICD-10-CM | POA: Diagnosis not present

## 2018-01-31 DIAGNOSIS — J3081 Allergic rhinitis due to animal (cat) (dog) hair and dander: Secondary | ICD-10-CM | POA: Diagnosis not present

## 2018-02-05 DIAGNOSIS — J3081 Allergic rhinitis due to animal (cat) (dog) hair and dander: Secondary | ICD-10-CM | POA: Diagnosis not present

## 2018-02-05 DIAGNOSIS — J301 Allergic rhinitis due to pollen: Secondary | ICD-10-CM | POA: Diagnosis not present

## 2018-02-05 DIAGNOSIS — J3089 Other allergic rhinitis: Secondary | ICD-10-CM | POA: Diagnosis not present

## 2018-02-09 DIAGNOSIS — Z1231 Encounter for screening mammogram for malignant neoplasm of breast: Secondary | ICD-10-CM | POA: Diagnosis not present

## 2018-02-12 ENCOUNTER — Ambulatory Visit (INDEPENDENT_AMBULATORY_CARE_PROVIDER_SITE_OTHER): Payer: 59

## 2018-02-12 ENCOUNTER — Ambulatory Visit (INDEPENDENT_AMBULATORY_CARE_PROVIDER_SITE_OTHER): Payer: 59 | Admitting: Podiatry

## 2018-02-12 ENCOUNTER — Encounter: Payer: Self-pay | Admitting: Podiatry

## 2018-02-12 DIAGNOSIS — M7741 Metatarsalgia, right foot: Secondary | ICD-10-CM

## 2018-02-12 DIAGNOSIS — M21619 Bunion of unspecified foot: Secondary | ICD-10-CM

## 2018-02-12 DIAGNOSIS — M779 Enthesopathy, unspecified: Secondary | ICD-10-CM

## 2018-02-12 DIAGNOSIS — J3081 Allergic rhinitis due to animal (cat) (dog) hair and dander: Secondary | ICD-10-CM | POA: Diagnosis not present

## 2018-02-12 DIAGNOSIS — J3089 Other allergic rhinitis: Secondary | ICD-10-CM | POA: Diagnosis not present

## 2018-02-12 DIAGNOSIS — M205X1 Other deformities of toe(s) (acquired), right foot: Secondary | ICD-10-CM | POA: Diagnosis not present

## 2018-02-12 DIAGNOSIS — J301 Allergic rhinitis due to pollen: Secondary | ICD-10-CM | POA: Diagnosis not present

## 2018-02-12 NOTE — Patient Instructions (Signed)
Pre-Operative Instructions  Congratulations, you have decided to take an important step towards improving your quality of life.  You can be assured that the doctors and staff at [facility] will be with you every step of the way.  Here are some important things you should know:  1. Plan to be at the surgery center/hospital at least 1 (one) hour prior to your scheduled time, unless otherwise directed by the surgical center/hospital staff.  You must have a responsible adult accompany you, remain during the surgery and drive you home.  Make sure you have directions to the surgical center/hospital to ensure you arrive on time. 2. If you are having surgery at [facility] or [facility], you will need a copy of your medical history and physical form from your family physician within one month prior to the date of surgery. We will give you a form for your primary physician to complete.  3. We make every effort to accommodate the date you request for surgery.  However, there are times where surgery dates or times have to be moved.  We will contact you as soon as possible if a change in schedule is required.   4. No aspirin/ibuprofen for one week before surgery.  If you are on aspirin, any non-steroidal anti-inflammatory medications (Mobic, Aleve, Ibuprofen) should not be taken seven (7) days prior to your surgery.  You make take Tylenol for pain prior to surgery.  5. Medications - If you are taking daily heart and blood pressure medications, seizure, reflux, allergy, asthma, anxiety, pain or diabetes medications, make sure you notify the surgery center/hospital before the day of surgery so they can tell you which medications you should take or avoid the day of surgery. 6. No food or drink after midnight the night before surgery unless directed otherwise by surgical center/hospital staff. 7. No alcoholic beverages 24-hours prior to surgery.  No smoking 24-hours prior or 24-hours after  surgery. 8. Wear loose pants or shorts. They should be loose enough to fit over bandages, boots, and casts. 9. Don't wear slip-on shoes. Sneakers are preferred. 10. Bring your boot with you to the surgery center/hospital.  Also bring crutches or a walker if your physician has prescribed it for you.  If you do not have this equipment, it will be provided for you after surgery. 11. If you have not been contacted by the surgery center/hospital by the day before your surgery, call to confirm the date and time of your surgery. 12. Leave-time from work may vary depending on the type of surgery you have.  Appropriate arrangements should be made prior to surgery with your employer. 13. Prescriptions will be provided immediately following surgery by your doctor.  Fill these as soon as possible after surgery and take the medication as directed. Pain medications will not be refilled on weekends and must be approved by the doctor. 14. Remove nail polish on the operative foot and avoid getting pedicures prior to surgery. 15. Wash the night before surgery.  The night before surgery wash the foot and leg well with water and the antibacterial soap provided. Be sure to pay special attention to beneath the toenails and in between the toes.  Wash for at least three (3) minutes. Rinse thoroughly with water and dry well with a towel.  Perform this wash unless told not to do so by your physician.  Enclosed: 1 Ice pack (please put in freezer the night before surgery)   1 Hibiclens skin cleaner     Pre-op instructions  If you have any questions regarding the instructions, please do not hesitate to call our office.  [place]: [address], [place], [place] [address] -- [phone]  [place]: [address] [place], [place] [address] -- [phone]  Home: [address]  [place], [place] [address] -- [phone]  [place]: [address], [address], [place], [place] [address] -- [phone]  Website:  [email or web address] 

## 2018-02-12 NOTE — Progress Notes (Signed)
Subjective:    Patient ID: Heather Morales, female    DOB: Aug 05, 1961, 57 y.o.   MRN: [id number]  HPI 57 year old female presents the office today for concerns of a painful bump on the inside of her right big toe.  She states that it hurts to walk and she cannot wear heels.  She has had 2 previous surgeries to this area about 10 years ago.  She denies any recent injury or trauma.  She also gets quite a bit of pain she points to submetatarsal 2.  She has tried changing shoes and offloading with a significant improvement.  She denies any numbness or tingling.  She has no other concerns today.   Review of Systems  All other systems reviewed and are negative.  History reviewed. No pertinent past medical history.  History reviewed. No pertinent surgical history.   Current Outpatient Medications:  .  fluticasone (FLONASE) 50 MCG/ACT nasal spray, Place into both nostrils daily., Disp: , Rfl:  .  Lansoprazole (PREVACID PO), Take by mouth., Disp: , Rfl:  .  levocetirizine (XYZAL) 5 MG tablet, Take 5 mg by mouth every evening., Disp: , Rfl:  .  montelukast (SINGULAIR) 10 MG tablet, Take 10 mg by mouth at bedtime., Disp: , Rfl:  .  Vitamin D, Ergocalciferol, 2000 units CAPS, Take by mouth., Disp: , Rfl:  .  buPROPion (WELLBUTRIN XL) 300 MG 24 hr tablet, TAKE 1 TABLET BY MOUTH EVERY DAY IN THE MORNING, Disp: , Rfl: 1 .  progesterone (PROMETRIUM) 100 MG capsule, TAKE 1 CAPSULE BY MOUTH EVERY DAY IN THE EVENING AT BEDTIME, Disp: , Rfl: 11  Allergies  Allergen Reactions  . Penicillins     Social History   Socioeconomic History  . Marital status: Married    Spouse name: Not on file  . Number of children: Not on file  . Years of education: Not on file  . Highest education level: Not on file  Occupational History  . Not on file  Social Needs  . Financial resource strain: Not on file  . Food insecurity:    Worry: Not on file    Inability: Not on file  . Transportation needs:    Medical:  Not on file    Non-medical: Not on file  Tobacco Use  . Smoking status: Unknown If Ever Smoked  . Smokeless tobacco: Never Used  Substance and Sexual Activity  . Alcohol use: Not on file  . Drug use: Not on file  . Sexual activity: Not on file  Lifestyle  . Physical activity:    Days per week: Not on file    Minutes per session: Not on file  . Stress: Not on file  Relationships  . Social connections:    Talks on phone: Not on file    Gets together: Not on file    Attends religious service: Not on file    Active member of club or organization: Not on file    Attends meetings of clubs or organizations: Not on file    Relationship status: Not on file  . Intimate partner violence:    Fear of current or ex partner: Not on file    Emotionally abused: Not on file    Physically abused: Not on file    Forced sexual activity: Not on file  Other Topics Concern  . Not on file  Social History Narrative  . Not on file       Objective:   Physical Exam  General: AAO x3, NAD  Dermatological: There is mild erythema to the medial first metatarsal head on the dorsal medial aspect from a bony prominence causing irritation set issues but there is no skin breakdown identified today.  There is no open lesions or pre-ulcerative lesions identified.  Vascular: Dorsalis Pedis artery and Posterior Tibial artery pedal pulses are 2/4 bilateral with immedate capillary fill time. There is no pain with calf compression, swelling, warmth, erythema.   Neruologic: Grossly intact via light touch bilateral.  Protective threshold with Semmes Wienstein monofilament intact to all pedal sites bilateral.   Musculoskeletal: There is decreased range of motion of the first MTPJ on the right side.  There is tenderness submetatarsal 2 on the right foot as well.  There is no first ray hypomobility present.  There is tenderness palpation along the dorsal medial prominence of the first metatarsal.  There is no other area  tenderness identified.  Muscular strength 5/5 in all groups tested bilateral.  Gait: Unassisted, Nonantalgic.       Assessment & Plan:  57 year old female with right hallux limitus, capsulitis second metatarsal arthritic changes present the first MPJ -Treatment options discussed including all alternatives, risks, and complications -Etiology of symptoms were discussed -X-rays were obtained and reviewed with the patient..  Also elongated second metatarsal.  There is no evidence of acute fracture identified. -We did discussion regards to both conservative as well as surgical treatment options.  After discussion she like to proceed with surgical intervention.  Discussed various surgical options for her and after we discussed them in detail she elected to proceed with a Cartiva implant for the 1st MTPJ as well as removal of the bone spurs from the first MPJ, second metatarsal shortening osteotomy. We discussed this is not a guarantee of resolution of symptoms and that this implant is a new implant we do not have long-term success rates.  Ultimately she may need to have another surgery for imaging fusion she understands this. -The incision placement as well as the postoperative course was discussed with the patient. I discussed risks of the surgery which include, but not limited to, infection, bleeding, pain, swelling, need for further surgery, delayed or nonhealing, painful or ugly scar, numbness or sensation changes, over/under correction, recurrence, transfer lesions, further deformity, hardware failure, DVT/PE, loss of toe/foot. Patient understands these risks and wishes to proceed with surgery. The surgical consent was reviewed with the patient all 3 pages were signed. No promises or guarantees were given to the outcome of the procedure. All questions were answered to the [place] of my ability. Before the surgery the patient was encouraged to call the office if there is any further questions. The surgery will  be performed at the [facility] on an outpatient basis. -Cam boot dispensed for postoperative use  Trula Slade DPM

## 2018-02-19 DIAGNOSIS — J3089 Other allergic rhinitis: Secondary | ICD-10-CM | POA: Diagnosis not present

## 2018-02-19 DIAGNOSIS — J3081 Allergic rhinitis due to animal (cat) (dog) hair and dander: Secondary | ICD-10-CM | POA: Diagnosis not present

## 2018-02-19 DIAGNOSIS — J301 Allergic rhinitis due to pollen: Secondary | ICD-10-CM | POA: Diagnosis not present

## 2018-02-26 ENCOUNTER — Telehealth: Payer: Self-pay | Admitting: *Deleted

## 2018-02-26 NOTE — Telephone Encounter (Signed)
"  We're playing phone tag.  Thanks so much."

## 2018-02-26 NOTE — Telephone Encounter (Signed)
"  I'm returning your call.  How can I help you?  "Are you the one I speak to about my cost for the surgery?"  You need to talk to Jocelyn Lamer in our insurance department.  I will let her know.  She'll do your calculations and let you know.  However, you need to call the surgical center and ask about their fees as well as anesthesia.  You need to let anesthesia know how long your surgery will take.  Your surgery will take 1.5 hours.  "Okay I have to have FMLA papers filled out.  Who do I give those to in your office?"  FMLA papers go to [employer].  "How long will I have to be out of work?  I can sit on my job."  You can go back after a week as long as you can sit and elevate.   "Okay, that's what I thought and that's what I had planned.  Thank you so much for answering my questions."

## 2018-02-26 NOTE — Telephone Encounter (Signed)
"  I have surgery scheduled for the 29th, a week from Wednesday.  I just wanted to touch base.  I have a few questions."  I attempted to call her back.  I left her a message to call me back on her home phone number.  The message on her work number said not to leave a message.

## 2018-02-27 DIAGNOSIS — J3089 Other allergic rhinitis: Secondary | ICD-10-CM | POA: Diagnosis not present

## 2018-02-27 DIAGNOSIS — J301 Allergic rhinitis due to pollen: Secondary | ICD-10-CM | POA: Diagnosis not present

## 2018-02-27 DIAGNOSIS — J3081 Allergic rhinitis due to animal (cat) (dog) hair and dander: Secondary | ICD-10-CM | POA: Diagnosis not present

## 2018-03-02 DIAGNOSIS — Z780 Asymptomatic menopausal state: Secondary | ICD-10-CM | POA: Diagnosis not present

## 2018-03-02 DIAGNOSIS — R9389 Abnormal findings on diagnostic imaging of other specified body structures: Secondary | ICD-10-CM | POA: Diagnosis not present

## 2018-03-07 ENCOUNTER — Encounter: Payer: Self-pay | Admitting: Podiatry

## 2018-03-07 ENCOUNTER — Other Ambulatory Visit: Payer: Self-pay | Admitting: Podiatry

## 2018-03-07 DIAGNOSIS — M7751 Other enthesopathy of right foot: Secondary | ICD-10-CM | POA: Diagnosis not present

## 2018-03-07 DIAGNOSIS — M25571 Pain in right ankle and joints of right foot: Secondary | ICD-10-CM | POA: Diagnosis not present

## 2018-03-07 DIAGNOSIS — M779 Enthesopathy, unspecified: Secondary | ICD-10-CM | POA: Diagnosis not present

## 2018-03-07 DIAGNOSIS — M2021 Hallux rigidus, right foot: Secondary | ICD-10-CM | POA: Diagnosis not present

## 2018-03-07 DIAGNOSIS — M205X1 Other deformities of toe(s) (acquired), right foot: Secondary | ICD-10-CM | POA: Diagnosis not present

## 2018-03-07 DIAGNOSIS — M7731 Calcaneal spur, right foot: Secondary | ICD-10-CM | POA: Diagnosis not present

## 2018-03-07 MED ORDER — OXYCODONE-ACETAMINOPHEN 5-325 MG PO TABS: 1.0000 | ORAL_TABLET | ORAL | 0 refills | Status: DC | PRN

## 2018-03-07 MED ORDER — PROMETHAZINE HCL 25 MG PO TABS
25.0000 mg | ORAL_TABLET | Freq: Three times a day (TID) | ORAL | 0 refills | Status: DC | PRN
Start: 2018-03-07 — End: 2018-10-31

## 2018-03-07 MED ORDER — CLINDAMYCIN HCL 300 MG PO CAPS: 300.0000 mg | ORAL_CAPSULE | Freq: Three times a day (TID) | ORAL | 2 refills | Status: DC

## 2018-03-07 NOTE — Progress Notes (Signed)
Postop medications sent including clindamycin, percocet, phenergan

## 2018-03-12 ENCOUNTER — Encounter: Payer: Self-pay | Admitting: Podiatry

## 2018-03-12 ENCOUNTER — Telehealth: Payer: Self-pay | Admitting: *Deleted

## 2018-03-12 ENCOUNTER — Ambulatory Visit (INDEPENDENT_AMBULATORY_CARE_PROVIDER_SITE_OTHER): Payer: 59 | Admitting: Podiatry

## 2018-03-12 ENCOUNTER — Ambulatory Visit (INDEPENDENT_AMBULATORY_CARE_PROVIDER_SITE_OTHER): Payer: 59

## 2018-03-12 VITALS — BP 124/71 | HR 84 | Temp 96.2°F | Resp 18

## 2018-03-12 DIAGNOSIS — Z9889 Other specified postprocedural states: Secondary | ICD-10-CM

## 2018-03-12 DIAGNOSIS — M779 Enthesopathy, unspecified: Secondary | ICD-10-CM

## 2018-03-12 DIAGNOSIS — M205X1 Other deformities of toe(s) (acquired), right foot: Secondary | ICD-10-CM

## 2018-03-12 MED ORDER — HYDROCODONE-ACETAMINOPHEN 5-325 MG PO TABS
1.0000 | ORAL_TABLET | ORAL | 0 refills | Status: AC | PRN
Start: 2018-03-12 — End: ?

## 2018-03-12 NOTE — Telephone Encounter (Signed)
Called and patient is doing ok and there is no fever or chills or nausea and is using the ice and elevating and there is some tingling in the toes and does have some pain medicine to take if needed. Lattie Haw

## 2018-03-12 NOTE — Progress Notes (Signed)
Subjective: Heather Morales is a 57 y.o. is seen today in office s/p right foot Cartiva implant and second metatarsal shortening osteotomy preformed on 03/07/2018 and she states her pain has been improving.  She states that the pain medicine is making her sick however.  She has been in the cam boot she is internal ice and elevate.  She denies any recent falls or injury.  Denies any systemic complaints such as fevers, chills, nausea, vomiting. No calf pain, chest pain, shortness of breath.   Objective: General: No acute distress, AAOx3  DP/PT pulses palpable 2/4, CRT < 3 sec to all digits.  Protective sensation intact. Motor function intact.  RIGHT foot: Incision is well coapted without any evidence of dehiscence and sutures are intact. There is no surrounding erythema, ascending cellulitis, fluctuance, crepitus, malodor, drainage/purulence. There is mild  edema around the surgical site. There is mild pain along the surgical site.  There is no pain with MPJ range of motion. No other areas of tenderness to bilateral lower extremities.  No other open lesions or pre-ulcerative lesions.  No pain with calf compression, swelling, warmth, erythema.   Assessment and Plan:  Status post right foot surgery, doing well with no complications   -Treatment options discussed including all alternatives, risks, and complications -X-rays were obtained and reviewed.  No evidence of acute infection.  Hardware intact. -Antibiotic ointment and a bandage was applied.  Keep the dressing clean, dry, intact. -Cam boot at all times.  Weightbearing as tolerated. -Ice/elevation -Pain medication as needed. -Monitor for any clinical signs or symptoms of infection and DVT/PE and directed to call the office immediately should any occur or go to the ER. -Follow-up in 1 week or sooner if any problems arise. In the meantime, encouraged to call the office with any questions, concerns, change in symptoms.   Celesta Gentile, DPM

## 2018-03-14 ENCOUNTER — Encounter: Payer: Self-pay | Admitting: Podiatry

## 2018-03-19 ENCOUNTER — Encounter: Payer: Self-pay | Admitting: Podiatry

## 2018-03-19 ENCOUNTER — Ambulatory Visit (INDEPENDENT_AMBULATORY_CARE_PROVIDER_SITE_OTHER): Payer: 59 | Admitting: Podiatry

## 2018-03-19 VITALS — Temp 97.6°F

## 2018-03-19 DIAGNOSIS — M205X1 Other deformities of toe(s) (acquired), right foot: Secondary | ICD-10-CM

## 2018-03-19 DIAGNOSIS — M779 Enthesopathy, unspecified: Secondary | ICD-10-CM

## 2018-03-20 NOTE — Progress Notes (Signed)
Subjective: Heather Morales is a 57 y.o. is seen today in office s/p right foot Cartiva implant and second metatarsal shortening osteotomy preformed on 03/07/2018.  She states overall she is doing better.  She still has some swelling that she is noticed today when the bandage was removed but otherwise she has been doing well.  She has not been taking any pain medication recently.  Denies any recent injury or falls. Denies any systemic complaints such as fevers, chills, nausea, vomiting. No calf pain, chest pain, shortness of breath.   Objective: General: No acute distress, AAOx3  DP/PT pulses palpable 2/4, CRT < 3 sec to all digits.  Protective sensation intact. Motor function intact.  RIGHT foot: Incision is well coapted without any evidence of dehiscence and sutures are intact. There is no surrounding erythema, ascending cellulitis, fluctuance, crepitus, malodor, drainage/purulence. There is mild but improved edema around the surgical site. There is minimal to no pain along the surgical site.  There is no pain with MPJ range of motion. No other areas of tenderness to bilateral lower extremities.  No other open lesions or pre-ulcerative lesions.  No pain with calf compression, swelling, warmth, erythema.   Assessment and Plan:  Status post right foot surgery, doing well with no complications   -Treatment options discussed including all alternatives, risks, and complications -Still some motion across the incisions therefore left the sutures intact.  Antibiotic ointment and a dressing was applied.  Keep the dressing clean, dry, intact otherwise she can change it with a similar dressing every couple days if needed.  Hold off on getting the area wet. -Continue cam boot at all times.  She will be weightbearing as tolerated. -Cam boot at all times.  Weightbearing as tolerated. -Ice/elevation -Pain medication as needed. -She is going to be going back to work tomorrow but she can get something to drive  her and she is a sitting job for the most part.  Encouraged her to keep ice on the area she can as well as keeping the foot elevated. -Monitor for any clinical signs or symptoms of infection and DVT/PE and directed to call the office immediately should any occur or go to the ER. -Follow-up in as scheduled for suture removal sooner if any problems arise. In the meantime, encouraged to call the office with any questions, concerns, change in symptoms.   Celesta Gentile, DPM

## 2018-03-26 ENCOUNTER — Encounter: Payer: Self-pay | Admitting: Podiatry

## 2018-03-26 ENCOUNTER — Ambulatory Visit (INDEPENDENT_AMBULATORY_CARE_PROVIDER_SITE_OTHER): Payer: 59 | Admitting: Podiatry

## 2018-03-26 VITALS — Temp 98.6°F

## 2018-03-26 DIAGNOSIS — Z9889 Other specified postprocedural states: Secondary | ICD-10-CM

## 2018-03-26 DIAGNOSIS — M779 Enthesopathy, unspecified: Secondary | ICD-10-CM

## 2018-03-26 DIAGNOSIS — M205X1 Other deformities of toe(s) (acquired), right foot: Secondary | ICD-10-CM

## 2018-03-26 MED ORDER — CLINDAMYCIN HCL 300 MG PO CAPS: 300.0000 mg | ORAL_CAPSULE | Freq: Three times a day (TID) | ORAL | 2 refills | Status: DC

## 2018-03-26 MED ORDER — CLINDAMYCIN HCL 300 MG PO CAPS: 300.0000 mg | ORAL_CAPSULE | Freq: Three times a day (TID) | ORAL | 0 refills | Status: DC

## 2018-03-27 NOTE — Progress Notes (Signed)
Subjective: Heather Morales is a 57 y.o. is seen today in office s/p right foot Cartiva implant and second metatarsal shortening osteotomy preformed on 03/07/2018.  She presents today for suture removal.  She states that she has been on her feet more than turning her back to work.  She has remained in the cam boot.  Overall she feels well she denies any fevers, chills, nausea, vomiting.  Denies any calf pain, chest pain, shortness of breath. Denies any recent injury or falls. Denies any systemic complaints such as fevers, chills, nausea, vomiting. No calf pain, chest pain, shortness of breath.   Objective: General: No acute distress, AAOx3  DP/PT pulses palpable 2/4, CRT < 3 sec to all digits.  Protective sensation intact. Motor function intact.  RIGHT foot: Incision is well coapted without any evidence of dehiscence and sutures are intact.  There is faint surrounding erythema along the incision on the first MTPJ but there is no area of fluctuation or crepitation.  There is no significant increase in warmth of this area compared to the other areas of the foot and there is no ascending cellulitis.  There is no malodor.  There is no drainage or pus coming from the incision and the incisions remain well coapted.  I think some of this erythema although faint is coming more from inflammation as opposed to infection.  The toes are in rectus position.  There is no pain with MPJ range of motion.  No significant discomfort to palpation the surgical site itself. No other open lesions or pre-ulcerative lesions.  No pain with calf compression, swelling, warmth, erythema.   Assessment and Plan:  Status post right foot surgery, doing well with no complications   -Treatment options discussed including all alternatives, risks, and complications -To remove the sutures were any complications.  Incision remains well coapted.  I applied Steri-Strips for reinforcement followed by antibiotic ointment and a bandage.  There is  some faint erythema although again this is more from inflammation related to the antibiotic for the next week precaution.  This was sent to her pharmacy today.  There is on to Wednesday and then she can start to wash the foot with soap and water and apply antibiotic ointment and a bandage.  Remaining at all times.  Ice elevation.  Pain medication as needed however she has not been requiring this. -Monitor for any clinical signs or symptoms of infection and directed to call the office immediately should any occur or go to the ER. -RTC 1 week or sooner if needed  Trula Slade DPM

## 2018-04-02 ENCOUNTER — Encounter: Payer: Self-pay | Admitting: Podiatry

## 2018-04-02 ENCOUNTER — Ambulatory Visit (INDEPENDENT_AMBULATORY_CARE_PROVIDER_SITE_OTHER): Payer: 59 | Admitting: Podiatry

## 2018-04-02 ENCOUNTER — Ambulatory Visit (INDEPENDENT_AMBULATORY_CARE_PROVIDER_SITE_OTHER): Payer: 59

## 2018-04-02 DIAGNOSIS — M779 Enthesopathy, unspecified: Secondary | ICD-10-CM

## 2018-04-02 DIAGNOSIS — M205X1 Other deformities of toe(s) (acquired), right foot: Secondary | ICD-10-CM

## 2018-04-05 NOTE — Progress Notes (Signed)
Subjective: Heather Morales is a 57 y.o. is seen today in office s/p right foot Cartiva implant and second metatarsal shortening osteotomy preformed on 03/07/2018. She states that she is doing well. She states that she did not take the antibiotic as she felt that she did not need it. She has been wearing the surgical boot.  She states that she is doing well and her pain is much improved.  She denies any recent injury or fall. Denies any systemic complaints such as fevers, chills, nausea, vomiting. No calf pain, chest pain, shortness of breath.   Objective: General: No acute distress, AAOx3  DP/PT pulses palpable 2/4, CRT < 3 sec to all digits.  Protective sensation intact. Motor function intact.  RIGHT foot: Incision is well coapted without any evidence of dehiscence and scars are formed.  A small amount of scab is present over the incision.  There is no significant edema, erythema, increased warmth and overall the edema has improved.  There is no drainage or pus come from the incisions.  There is no clinical signs of infection noted today.  There is no pain with MPJ range of motion and clinically does appear to have adequate MPJ range of motion present.  There is no significant discomfort to the second metatarsal surgical site.  No other open lesions or pre-ulcerative lesions.  No pain with calf compression, swelling, warmth, erythema.   Assessment and Plan:  Status post right foot surgery, doing well with no complications   -Treatment options discussed including all alternatives, risks, and complications -X-rays were obtained reviewed today.  Hardware intact.  No evidence of acute fracture and overall she is doing well.  Because of this will return to transition to a surgical shoe which was dispensed today.  Also will start physical therapy and prescription for [facility] was provided.  Over the next 2 weeks she can start to transition to regular shoe as tolerated.  Continue ice and  elevate.  Pain medication as needed.  *Follow-up in 2 weeks for repeat x-ray.  Likely transition to a shoe full-time if she has not at that point.  Trula Slade DPM

## 2018-04-09 DIAGNOSIS — N8500 Endometrial hyperplasia, unspecified: Secondary | ICD-10-CM | POA: Diagnosis not present

## 2018-04-09 DIAGNOSIS — N882 Stricture and stenosis of cervix uteri: Secondary | ICD-10-CM | POA: Diagnosis not present

## 2018-04-09 DIAGNOSIS — N939 Abnormal uterine and vaginal bleeding, unspecified: Secondary | ICD-10-CM | POA: Diagnosis not present

## 2018-04-09 DIAGNOSIS — Z3202 Encounter for pregnancy test, result negative: Secondary | ICD-10-CM | POA: Diagnosis not present

## 2018-04-16 ENCOUNTER — Ambulatory Visit (INDEPENDENT_AMBULATORY_CARE_PROVIDER_SITE_OTHER): Payer: 59

## 2018-04-16 ENCOUNTER — Ambulatory Visit (INDEPENDENT_AMBULATORY_CARE_PROVIDER_SITE_OTHER): Payer: 59 | Admitting: Podiatry

## 2018-04-16 DIAGNOSIS — Z9889 Other specified postprocedural states: Secondary | ICD-10-CM

## 2018-04-16 DIAGNOSIS — M779 Enthesopathy, unspecified: Secondary | ICD-10-CM

## 2018-04-16 DIAGNOSIS — M205X1 Other deformities of toe(s) (acquired), right foot: Secondary | ICD-10-CM

## 2018-04-16 NOTE — Progress Notes (Signed)
Subjective: Heather Morales is a 57 y.o. is seen today in office s/p right foot Cartiva implant and second metatarsal shortening osteotomy preformed on 03/07/2018.  She states that she is doing well and she has been back in a sandal since last Wednesday. She states that she does not have any pain.  She does report some swelling.  She denies any redness or drainage from her incision.  She does wear an ACE bandage for swelling.  She denies any recent injury.  She has no other concerns today.  Denies any systemic complaints such as fevers, chills, nausea, vomiting. No calf pain, chest pain, shortness of breath.   Objective: General: No acute distress, AAOx3  DP/PT pulses palpable 2/4, CRT < 3 sec to all digits.  Protective sensation intact. Motor function intact.  RIGHT foot: Incision is well coapted without any evidence of dehiscence and scars are formed.  On the second metatarsal osteotomy incision proximally there appears to be a single suture.  On the skin causing a slight amount of irritation.  I debrided this and was able to remove a piece of suture however there is no drainage or pus identified in this area.  There is faint erythema but there is no ascending cellulitis or increase in warmth.  There is no pain to the area.  In general there is no pain at the surgical site.  There is no pain with MPJ range of motion there does appear to be adequate range of motion of the first MPJ.  There is no pain or crepitation with MPJ range of motion. No other open lesions or pre-ulcerative lesions.  No pain with calf compression, swelling, warmth, erythema.   Assessment and Plan:  Status post right foot surgery, doing well with no complications ; suture reaction   -Treatment options discussed including all alternatives, risks, and complications -X-rays were obtained reviewed today.  Hardware intact.  No evidence of acute fracture and overall she is doing well.  -She can continue the current regular shoe as  tolerable.  Continue with range of motion exercises.  I dispensed a compression anklet for her for swelling purposes.  I did debride the area on the second metatarsal incision to remove a single suture which caused a small amount of irritation but there is no signs of infection.  Recommend antibiotic ointment and a bandage daily. Monitor for any clinical signs or symptoms of infection and directed to call the office immediately should any occur or go to the ER.  Return in about 3 weeks (around 05/07/2018).  Trula Slade DPM

## 2018-04-18 DIAGNOSIS — M542 Cervicalgia: Secondary | ICD-10-CM | POA: Diagnosis not present

## 2018-04-18 DIAGNOSIS — M545 Low back pain: Secondary | ICD-10-CM | POA: Diagnosis not present

## 2018-04-18 DIAGNOSIS — M9901 Segmental and somatic dysfunction of cervical region: Secondary | ICD-10-CM | POA: Diagnosis not present

## 2018-05-07 ENCOUNTER — Encounter: Payer: 59 | Admitting: Podiatry

## 2018-05-21 ENCOUNTER — Encounter: Payer: Self-pay | Admitting: Podiatry

## 2018-05-21 ENCOUNTER — Ambulatory Visit (INDEPENDENT_AMBULATORY_CARE_PROVIDER_SITE_OTHER): Payer: 59

## 2018-05-21 ENCOUNTER — Ambulatory Visit (INDEPENDENT_AMBULATORY_CARE_PROVIDER_SITE_OTHER): Payer: 59 | Admitting: Podiatry

## 2018-05-21 DIAGNOSIS — M205X1 Other deformities of toe(s) (acquired), right foot: Secondary | ICD-10-CM

## 2018-05-21 DIAGNOSIS — M779 Enthesopathy, unspecified: Secondary | ICD-10-CM

## 2018-05-23 NOTE — Progress Notes (Signed)
Subjective: Heather Morales is a 57 y.o. is seen today in office s/p right foot Cartiva implant and second metatarsal shortening osteotomy preformed on 03/07/2018.  Overall she states that she is doing well.  She is wearing regular shoes and she presents today wearing a shoe with a heel.  She has some occasional discomfort but overall is doing much better. She statesthe pain is very minimal.  No significant swelling or redness.  She has no other concerns today.  She says the incision is well-healed.  Denies any systemic complaints such as fevers, chills, nausea, vomiting. No calf pain, chest pain, shortness of breath.   Objective: General: No acute distress, AAOx3  DP/PT pulses palpable 2/4, CRT < 3 sec to all digits.  Protective sensation intact. Motor function intact.  RIGHT foot: Incision is well coapted without any evidence of dehiscence and scars are formed.  There is no significant discomfort to the surgical sites.  There is no pain with MPJ range of motion of the first or second.  Minimal swelling there is no significant erythema or increase in warmth.  She gets some occasional discomfort the hallux IPJ but no tenderness palpation on exam today.  No other areas of tenderness. No other open lesions or pre-ulcerative lesions.  No pain with calf compression, swelling, warmth, erythema.   Assessment and Plan:  Status post right foot surgery, doing well with no complications   -Treatment options discussed including all alternatives, risks, and complications -X-rays were obtained reviewed.  Arthritic changes present the first MPJ.  Osteotomy healed to the second metatarsal. -Overall she is doing well.  She is having no significant discomfort she is back to wearing regular shoe without any problems.  Continue to work on the scar with cocoa butter or vitamin E cream and continue to wear supportive shoes.  If there is any worsening symptoms she is to call me know however I would discharge her from the  postoperative care she is doing well she agrees with this plan.   Trula Slade DPM

## 2018-07-16 DIAGNOSIS — Z23 Encounter for immunization: Secondary | ICD-10-CM | POA: Diagnosis not present

## 2018-08-23 DIAGNOSIS — I10 Essential (primary) hypertension: Secondary | ICD-10-CM | POA: Diagnosis not present

## 2018-08-24 DIAGNOSIS — J45909 Unspecified asthma, uncomplicated: Secondary | ICD-10-CM | POA: Diagnosis not present

## 2018-08-24 DIAGNOSIS — K219 Gastro-esophageal reflux disease without esophagitis: Secondary | ICD-10-CM | POA: Diagnosis not present

## 2018-08-24 DIAGNOSIS — I10 Essential (primary) hypertension: Secondary | ICD-10-CM | POA: Diagnosis not present

## 2018-08-25 ENCOUNTER — Emergency Department (HOSPITAL_COMMUNITY)
Admission: EM | Admit: 2018-08-25 | Discharge: 2018-08-26 | Disposition: A | Discharge status: Home / Self Care | Payer: 59 | Attending: Emergency Medicine | Admitting: Emergency Medicine

## 2018-08-25 DIAGNOSIS — W01190A Fall on same level from slipping, tripping and stumbling with subsequent striking against furniture, initial encounter: Secondary | ICD-10-CM | POA: Diagnosis not present

## 2018-08-25 DIAGNOSIS — Z79899 Other long term (current) drug therapy: Secondary | ICD-10-CM | POA: Diagnosis not present

## 2018-08-25 DIAGNOSIS — Y999 Unspecified external cause status: Secondary | ICD-10-CM | POA: Diagnosis not present

## 2018-08-25 DIAGNOSIS — Y92009 Unspecified place in unspecified non-institutional (private) residence as the place of occurrence of the external cause: Secondary | ICD-10-CM | POA: Insufficient documentation

## 2018-08-25 DIAGNOSIS — W19XXXA Unspecified fall, initial encounter: Secondary | ICD-10-CM

## 2018-08-25 DIAGNOSIS — R52 Pain, unspecified: Secondary | ICD-10-CM | POA: Diagnosis not present

## 2018-08-25 DIAGNOSIS — S43014A Anterior dislocation of right humerus, initial encounter: Secondary | ICD-10-CM | POA: Diagnosis not present

## 2018-08-25 DIAGNOSIS — I10 Essential (primary) hypertension: Secondary | ICD-10-CM | POA: Diagnosis not present

## 2018-08-25 DIAGNOSIS — Y93E9 Activity, other interior property and clothing maintenance: Secondary | ICD-10-CM | POA: Diagnosis not present

## 2018-08-25 DIAGNOSIS — M25519 Pain in unspecified shoulder: Secondary | ICD-10-CM | POA: Diagnosis not present

## 2018-08-25 DIAGNOSIS — S4991XA Unspecified injury of right shoulder and upper arm, initial encounter: Secondary | ICD-10-CM | POA: Diagnosis not present

## 2018-08-25 NOTE — ED Triage Notes (Signed)
Per EMS pt coming from home with a c/o R shoulder injury after she tripped and fell in her kitchen. EMS there was obvious deformity, they were able to move her arm down to splint it. Pt was given 250 mcg IV  fentanyl en route, however sts pain is still unrelieved.

## 2018-08-26 ENCOUNTER — Encounter (HOSPITAL_COMMUNITY): Payer: Self-pay | Admitting: *Deleted

## 2018-08-26 ENCOUNTER — Other Ambulatory Visit: Payer: Self-pay

## 2018-08-26 ENCOUNTER — Emergency Department (HOSPITAL_COMMUNITY): Payer: 59

## 2018-08-26 DIAGNOSIS — Z79899 Other long term (current) drug therapy: Secondary | ICD-10-CM | POA: Diagnosis not present

## 2018-08-26 DIAGNOSIS — S4991XA Unspecified injury of right shoulder and upper arm, initial encounter: Secondary | ICD-10-CM | POA: Diagnosis not present

## 2018-08-26 DIAGNOSIS — S43014A Anterior dislocation of right humerus, initial encounter: Secondary | ICD-10-CM | POA: Diagnosis not present

## 2018-08-26 MED ORDER — OXYCODONE HCL 5 MG PO CAPS: 5.0000 mg | ORAL_CAPSULE | ORAL | 0 refills | Status: AC | PRN

## 2018-08-26 MED ORDER — HYDROMORPHONE HCL 1 MG/ML IJ SOLN
1.0000 mg | Freq: Once | INTRAMUSCULAR | Status: AC
  Administered 2018-08-26: 1 mg via INTRAVENOUS
  Filled 2018-08-26: qty 1

## 2018-08-26 MED ORDER — KETAMINE HCL 50 MG/5ML IJ SOSY
1.0000 mg/kg | PREFILLED_SYRINGE | Freq: Once | INTRAMUSCULAR | Status: AC
  Administered 2018-08-26: 100 mg via INTRAVENOUS
  Filled 2018-08-26: qty 10

## 2018-08-26 MED ORDER — PROPOFOL 10 MG/ML IV BOLUS
100.0000 mg | Freq: Once | INTRAVENOUS | Status: AC
  Administered 2018-08-26: 100 mg via INTRAVENOUS
  Filled 2018-08-26: qty 20

## 2018-08-26 NOTE — Discharge Instructions (Signed)
You were seen in the ER for right shoulder pain.  You had an anterior/inferior right shoulder dislocation.  This was reduced back into place which was confirmed with x-ray.  Wear your sling until you are reevaluated by orthopedics.  Return to the ER if you feel the joint popping back out, there is loss of sensation or weakness/drop of your hand or fingers.  Take 600 mg of ibuprofen and/or 500 to 1000 mg of acetaminophen every 6 hours for pain.  Take oxycodone 5 mg every 4-6 hours for breakthrough or severe pain.  Ice.

## 2018-08-26 NOTE — ED Provider Notes (Signed)
[facility] DEPT Provider Note   CSN: [id number] Arrival date & time: 08/25/18  2345     History   Chief Complaint Chief Complaint  Patient presents with  . Shoulder Injury    HPI Heather Morales is a 57 y.o. female here for evaluation of right shoulder pain.  Sudden, severe, constant.  Onset immediately after mechanical fall.  Patient states she has been rearranging furniture all day today, unfortunately lost her balance and fell onto her right shoulder on top of a chair.  She developed pain immediately after the fall.  She has been unable to move her shoulder secondary to pain.  She denies any other trauma to her head.  She denies any LOC, headache, vision changes, neck pain, chest pain, shortness of breath.  Denies distal paresthesias or loss of sensation.  No previous injury to her right shoulder.  She got IV fentanyl on route and states the pain is not better.  Pain is aggravated with any movement or palpation.  No alleviating factors.  She is left-hand dominant.  She has never dislocated or injured the shoulder in the past.  HPI  History reviewed. No pertinent past medical history.  There are no active problems to display for this patient.   History reviewed. No pertinent surgical history.   OB History   None      Home Medications    Prior to Admission medications   Medication Sig Start Date End Date Taking? Authorizing Provider  buPROPion (WELLBUTRIN XL) 300 MG 24 hr tablet TAKE 1 TABLET BY MOUTH EVERY DAY IN THE MORNING 01/30/18   [provider]  clindamycin (CLEOCIN) 300 MG capsule Take 1 capsule (300 mg total) by mouth 3 (three) times daily. 03/26/18   Trula Slade, DPM  clindamycin (CLEOCIN) 300 MG capsule Take 1 capsule (300 mg total) by mouth 3 (three) times daily. 03/26/18   Trula Slade, DPM  fluticasone (FLONASE) 50 MCG/ACT nasal spray Place into both nostrils daily.    [provider]    HYDROcodone-acetaminophen (NORCO/VICODIN) 5-325 MG tablet Take 1 tablet by mouth every 4 (four) hours as needed. 03/12/18   Trula Slade, DPM  Lansoprazole (PREVACID PO) Take by mouth.    [provider]  levocetirizine (XYZAL) 5 MG tablet Take 5 mg by mouth every evening.    [provider]  montelukast (SINGULAIR) 10 MG tablet Take 10 mg by mouth at bedtime.    [provider]  oxycodone (OXY-IR) 5 MG capsule Take 1 capsule (5 mg total) by mouth every 4 (four) hours as needed for up to 2 days. 08/26/18 08/28/18  Kinnie Feil, PA-C  oxyCODONE-acetaminophen (PERCOCET/ROXICET) 5-325 MG tablet Take 1 tablet by mouth every 4 (four) hours as needed for severe pain. 03/07/18   Trula Slade, DPM  progesterone (PROMETRIUM) 100 MG capsule TAKE 1 CAPSULE BY MOUTH EVERY DAY IN THE EVENING AT BEDTIME 01/16/18   [provider]  promethazine (PHENERGAN) 25 MG tablet Take 1 tablet (25 mg total) by mouth every 8 (eight) hours as needed for nausea or vomiting. 03/07/18   Trula Slade, DPM  Vitamin D, Ergocalciferol, 2000 units CAPS Take by mouth.    [provider]    Family History No family history on file.  Social History Social History   Tobacco Use  . Smoking status: Never Smoker  . Smokeless tobacco: Never Used  Substance Use Topics  . Alcohol use: Yes  . Drug  use: Never     Allergies   Penicillins   Review of Systems Review of Systems  Musculoskeletal: Positive for arthralgias.  All other systems reviewed and are negative.    Physical Exam Updated Vital Signs BP (!) 163/98   Pulse 78   Temp 98.1 F (36.7 C) (Oral)   Resp 14   Ht 5\' 7"  (1.702 m)   Wt 99.8 kg   SpO2 100%   BMI 34.46 kg/m   Physical Exam  Constitutional: She is oriented to person, place, and time. She appears well-developed and well-nourished. No distress.  NAD. Crying in pain.   HENT:  Head: Normocephalic and atraumatic.  Right Ear: External  ear normal.  Left Ear: External ear normal.  Nose: Nose normal.  No tenderness or signs of trauma to facial, nasal, scalp bones.  Eyes: Conjunctivae and EOM are normal. No scleral icterus.  Neck: Normal range of motion. Neck supple.  C-spine: No midline tenderness.  No paraspinal muscle tenderness.  Full range of motion of the neck.  Cardiovascular: Normal rate, regular rhythm and normal heart sounds.  No murmur heard. 1+ radial pulses bilaterally.  Pulmonary/Chest: Effort normal and breath sounds normal. She has no wheezes.  Musculoskeletal: Normal range of motion. She exhibits tenderness and deformity.  Right shoulder: Obvious prominence to the right shoulder, diffuse anterior tenderness.  Patient guarding joint and avoiding any movement.  No focal bony tenderness to the sternum, clavicle, humerus, epicondyles.  Full range of motion of the right wrist and fingers without pain.  Neurological: She is alert and oriented to person, place, and time.  Sensation to light touch and sharp and intact in median, ulnar, radial nerve distribution of the right hand.  5/5 strength with handgrip.  Skin: Skin is warm and dry. Capillary refill takes less than 2 seconds.  Psychiatric: She has a normal mood and affect. Her behavior is normal. Judgment and thought content normal.  Nursing note and vitals reviewed.    ED Treatments / Results  Labs (all labs ordered are listed, but only abnormal results are displayed) Labs Reviewed - No data to display  EKG None  Radiology Dg Shoulder Right  Result Date: 08/26/2018 CLINICAL DATA:  Status post reduction of right humeral head dislocation. Initial encounter. EXAM: RIGHT SHOULDER - 2+ VIEW COMPARISON:  Right shoulder radiographs performed earlier today at 12:25 a.m. FINDINGS: There has been successful reduction of the right humeral head dislocation. No definite Hill-Sachs or osseous Bankart lesion is seen. Mild degenerative change is noted at the right  acromioclavicular joint. The visualized portions of the right lung are clear. No definite soft tissue abnormalities are characterized on radiograph. IMPRESSION: Successful reduction of right humeral head dislocation. No definite Hill-Sachs or osseous Bankart lesion seen. Electronically Signed   By: Garald Balding M.D.   On: 08/26/2018 02:19   Dg Shoulder Right  Result Date: 08/26/2018 CLINICAL DATA:  Status post fall, shoulder pain EXAM: RIGHT SHOULDER - 2+ VIEW COMPARISON:  None. FINDINGS: Anterior inferior right shoulder dislocation. No acute fracture. No aggressive osseous lesion. Mild arthropathy of the right acromioclavicular joint. IMPRESSION: Anterior inferior right shoulder dislocation. Electronically Signed   By: Kathreen Devoid   On: 08/26/2018 00:46    Procedures Procedures (including critical care time)  Medications Ordered in ED Medications  HYDROmorphone (DILAUDID) injection 1 mg (1 mg Intravenous Given 08/26/18 0127)  ketamine 50 mg in normal saline 5 mL (10 mg/mL) syringe (100 mg Intravenous Given 08/26/18 0129)  propofol (DIPRIVAN) 10  mg/mL bolus/IV push 100 mg (100 mg Intravenous Given 08/26/18 0132)     Initial Impression / Assessment and Plan / ED Course  I have reviewed the triage vital signs and the nursing notes.  Pertinent labs & imaging results that were available during my care of the patient were reviewed by me and considered in my medical decision making (see chart for details).    57 year old here with traumatic right shoulder dislocation.  Extremities neurovascularly intact.  Procedural sedation and reduction by Dr. Leonette Monarch.  There were no immediate complications after procedure.  Postreduction x-rays are reassuring.  She reports intermittent tingling to her right thumb and index.  Radial pulse and sensation and strength intact in the right hand prior to discharge.  Will discharge with sling, high-dose NSAIDs, oxycodone and follow-up with orthopedist.  Return  precautions were discussed.  Patient and friend are comfortable with this plan.  Final Clinical Impressions(s) / ED Diagnoses   Final diagnoses:  Anterior dislocation of right shoulder, initial encounter  Fall, initial encounter    ED Discharge Orders         Ordered    oxycodone (OXY-IR) 5 MG capsule  Every 4 hours PRN     08/26/18 0227           Kinnie Feil, PA-C 08/26/18 0300    Fatima Blank, MD 08/26/18 [phone]

## 2018-08-26 NOTE — Progress Notes (Signed)
Pt. Required ventilation assistance during procedure, Bag mask ventilation with 100% fio2 used during procedure.

## 2018-08-26 NOTE — ED Provider Notes (Signed)
Medical screening examination/treatment/procedure(s) were conducted as a shared visit with non-physician practitioner(s) and myself.  I personally evaluated the patient during the encounter. Briefly, the patient is a 57 y.o. female who presents after mechanical fall resulting in right shoulder dislocation.  No fractures noted on plain film.  Dislocation confirmed.  Reduced under conscious sedation.  Postreduction film without Hill-Sachs or Bankart lesion.   The patient is safe for discharge with strict return precautions.    EKG Interpretation None        .Sedation Date/Time: 08/26/2018 8:33 AM Performed by: Fatima Blank, MD Authorized by: Fatima Blank, MD   Consent:    Consent obtained:  Verbal   Consent given by:  Patient   Risks discussed:  Allergic reaction, dysrhythmia, inadequate sedation, nausea, prolonged hypoxia resulting in organ damage, prolonged sedation necessitating reversal, respiratory compromise necessitating ventilatory assistance and intubation and vomiting   Alternatives discussed:  Analgesia without sedation, anxiolysis and regional anesthesia Universal protocol:    Procedure explained and questions answered to patient or proxy's satisfaction: yes     Relevant documents present and verified: yes     Test results available and properly labeled: yes     Imaging studies available: yes     Required blood products, implants, devices, and special equipment available: yes     Site/side marked: yes     Immediately prior to procedure a time out was called: yes     Patient identity confirmation method:  Verbally with patient Indications:    Procedure necessitating sedation performed by:  Physician performing sedation Pre-sedation assessment:    Time since last food or drink:  2 hr   ASA classification: class 1 - normal, healthy patient     Neck mobility: normal     Mouth opening:  3 or more finger widths   Thyromental distance:  4 finger widths   Mallampati score:  I - soft palate, uvula, fauces, pillars visible   Pre-sedation assessments completed and reviewed: airway patency, cardiovascular function, hydration status, mental status, nausea/vomiting, pain level, respiratory function and temperature     Pre-sedation assessment completed:  08/26/2018 1:15 AM Immediate pre-procedure details:    Reassessment: Patient reassessed immediately prior to procedure     Reviewed: vital signs, relevant labs/tests and NPO status     Verified: bag valve mask available, emergency equipment available, intubation equipment available, IV patency confirmed, oxygen available and suction available   Procedure details (see MAR for exact dosages):    Preoxygenation:  Nasal cannula   Sedation:  Propofol and ketamine   Intra-procedure monitoring:  Blood pressure monitoring, cardiac monitor, continuous pulse oximetry, frequent LOC assessments, frequent vital sign checks and continuous capnometry   Intra-procedure events: none     Total Provider sedation time (minutes):  15 Post-procedure details:    Post-sedation assessment completed:  08/26/2018 2:00 AM   Attendance: Constant attendance by certified staff until patient recovered     Recovery: Patient returned to pre-procedure baseline     Post-sedation assessments completed and reviewed: airway patency, cardiovascular function, hydration status, mental status, nausea/vomiting, pain level, respiratory function and temperature     Patient is stable for discharge or admission: yes     Patient tolerance:  Tolerated well, no immediate complications .Ortho Injury Treatment Date/Time: 08/26/2018 8:34 AM Performed by: Fatima Blank, MD Authorized by: Fatima Blank, MD   Consent:    Consent obtained:  Written   Risks discussed:  Irreducible dislocation, recurrent dislocation and fracture  Alternatives discussed:  ImmobilizationInjury location: shoulder Location details: right shoulder Injury  type: dislocation Dislocation type: anterior Hill-Sachs deformity: no Chronicity: new Pre-procedure neurovascular assessment: neurovascularly intact Manipulation performed: yes Reduction method: external rotation Reduction successful: yes X-ray confirmed reduction: yes Immobilization: sling Post-procedure neurovascular assessment: post-procedure neurovascularly intact        , Grayce Sessions, MD 08/26/18 [phone]

## 2018-08-26 NOTE — ED Notes (Signed)
Patient transported to X-ray 

## 2018-08-26 NOTE — Progress Notes (Signed)
Called to assist with pt. Who has dislocation and needs conscience sedation.  HR 86, saturation 100 on 2 lpm [place] with BP of 172/139 prior to procedure.

## 2018-08-26 NOTE — ED Notes (Addendum)
Patient Transported to XR.

## 2018-09-04 DIAGNOSIS — S43014A Anterior dislocation of right humerus, initial encounter: Secondary | ICD-10-CM | POA: Diagnosis not present

## 2018-09-20 DIAGNOSIS — M25511 Pain in right shoulder: Secondary | ICD-10-CM | POA: Diagnosis not present

## 2018-09-24 DIAGNOSIS — M25511 Pain in right shoulder: Secondary | ICD-10-CM | POA: Diagnosis not present

## 2018-09-26 DIAGNOSIS — M25511 Pain in right shoulder: Secondary | ICD-10-CM | POA: Diagnosis not present

## 2018-10-25 ENCOUNTER — Encounter (INDEPENDENT_AMBULATORY_CARE_PROVIDER_SITE_OTHER): Payer: 59

## 2018-10-31 ENCOUNTER — Encounter (INDEPENDENT_AMBULATORY_CARE_PROVIDER_SITE_OTHER): Payer: Self-pay | Admitting: Family Medicine

## 2018-10-31 ENCOUNTER — Ambulatory Visit (INDEPENDENT_AMBULATORY_CARE_PROVIDER_SITE_OTHER): Payer: 59 | Admitting: Family Medicine

## 2018-10-31 VITALS — BP 132/83 | HR 72 | Temp 97.8°F | Ht 66.0 in | Wt 214.0 lb

## 2018-10-31 DIAGNOSIS — R0602 Shortness of breath: Secondary | ICD-10-CM | POA: Diagnosis not present

## 2018-10-31 DIAGNOSIS — R5383 Other fatigue: Secondary | ICD-10-CM

## 2018-10-31 DIAGNOSIS — Z0289 Encounter for other administrative examinations: Secondary | ICD-10-CM

## 2018-10-31 DIAGNOSIS — I10 Essential (primary) hypertension: Secondary | ICD-10-CM | POA: Diagnosis not present

## 2018-10-31 DIAGNOSIS — Z6834 Body mass index (BMI) 34.0-34.9, adult: Secondary | ICD-10-CM

## 2018-10-31 DIAGNOSIS — R7303 Prediabetes: Secondary | ICD-10-CM | POA: Diagnosis not present

## 2018-10-31 DIAGNOSIS — Z9189 Other specified personal risk factors, not elsewhere classified: Secondary | ICD-10-CM | POA: Diagnosis not present

## 2018-10-31 DIAGNOSIS — E669 Obesity, unspecified: Secondary | ICD-10-CM

## 2018-10-31 DIAGNOSIS — F3289 Other specified depressive episodes: Secondary | ICD-10-CM | POA: Diagnosis not present

## 2018-11-01 LAB — LIPID PANEL WITH LDL/HDL RATIO
Cholesterol, Total: 186 mg/dL (ref 100–199)
HDL: 56 mg/dL (ref >39)
LDL CALC: 106 mg/dL — AB (ref 0–99)
LDL/HDL RATIO: 1.9 ratio (ref 0.0–3.2)
TRIGLYCERIDES: 118 mg/dL (ref 0–149)
VLDL Cholesterol Cal: 24 mg/dL (ref 5–40)

## 2018-11-01 LAB — COMPREHENSIVE METABOLIC PANEL
A/G RATIO: 1.8 (ref 1.2–2.2)
ALK PHOS: 107 IU/L (ref 39–117)
ALT: 33 IU/L — AB (ref 0–32)
AST: 19 IU/L (ref 0–40)
Albumin: 4.4 g/dL (ref 3.8–4.9)
BILIRUBIN TOTAL: 0.4 mg/dL (ref 0.0–1.2)
BUN/Creatinine Ratio: 13 (ref 9–23)
BUN: 11 mg/dL (ref 6–24)
CHLORIDE: 103 mmol/L (ref 96–106)
CO2: 21 mmol/L (ref 20–29)
Calcium: 9.3 mg/dL (ref 8.7–10.2)
Creatinine, Ser: 0.87 mg/dL (ref 0.57–1.00)
GFR calc Af Amer: 85 mL/min/{1.73_m2} (ref >59)
GFR calc non Af Amer: 74 mL/min/{1.73_m2} (ref >59)
GLOBULIN, TOTAL: 2.5 g/dL (ref 1.5–4.5)
Glucose: 85 mg/dL (ref 65–99)
POTASSIUM: 4.8 mmol/L (ref 3.5–5.2)
SODIUM: 142 mmol/L (ref 134–144)
Total Protein: 6.9 g/dL (ref 6.0–8.5)

## 2018-11-01 LAB — CBC WITH DIFFERENTIAL
BASOS: 1 %
Basophils Absolute: 0.1 10*3/uL (ref 0.0–0.2)
EOS (ABSOLUTE): 0.3 10*3/uL (ref 0.0–0.4)
Eos: 4 %
HEMATOCRIT: 43.6 % (ref 34.0–46.6)
Hemoglobin: 15 g/dL (ref 11.1–15.9)
Immature Grans (Abs): 0 10*3/uL (ref 0.0–0.1)
Immature Granulocytes: 0 %
LYMPHS ABS: 2.7 10*3/uL (ref 0.7–3.1)
Lymphs: 41 %
MCH: 32.3 pg (ref 26.6–33.0)
MCHC: 34.4 g/dL (ref 31.5–35.7)
MCV: 94 fL (ref 79–97)
MONOS ABS: 0.4 10*3/uL (ref 0.1–0.9)
Monocytes: 6 %
NEUTROS ABS: 3.2 10*3/uL (ref 1.4–7.0)
Neutrophils: 48 %
RBC: 4.64 x10E6/uL (ref 3.77–5.28)
RDW: 13.1 % (ref 11.7–15.4)
WBC: 6.6 10*3/uL (ref 3.4–10.8)

## 2018-11-01 LAB — VITAMIN D 25 HYDROXY (VIT D DEFICIENCY, FRACTURES): VIT D 25 HYDROXY: 49.3 ng/mL (ref 30.0–100.0)

## 2018-11-01 LAB — HEMOGLOBIN A1C
Est. average glucose Bld gHb Est-mCnc: 103 mg/dL
Hgb A1c MFr Bld: 5.2 % (ref 4.8–5.6)

## 2018-11-01 LAB — TSH: TSH: 1.48 u[IU]/mL (ref 0.450–4.500)

## 2018-11-01 LAB — VITAMIN B12: Vitamin B-12: 395 pg/mL (ref 232–1245)

## 2018-11-01 LAB — FOLATE: Folate: 12.6 ng/mL (ref >3.0)

## 2018-11-01 LAB — T3: T3 TOTAL: 88 ng/dL (ref 71–180)

## 2018-11-01 LAB — T4, FREE: Free T4: 1.07 ng/dL (ref 0.82–1.77)

## 2018-11-01 LAB — INSULIN, RANDOM: INSULIN: 30.9 u[IU]/mL — ABNORMAL HIGH (ref 2.6–24.9)

## 2018-11-01 NOTE — Progress Notes (Signed)
Office: [phone]  /  Fax: [phone]   Dear Dr. Addison Morales,   Thank you for referring Heather Morales to our clinic. The following note includes my evaluation and treatment recommendations.  HPI:   Chief Complaint: OBESITY    Heather Morales has been referred by Heather Caraway, MD for consultation regarding her obesity and obesity related comorbidities.    Heather Morales (MR# [id number]) is a 58 y.o. female who presents on 10/31/2018 for obesity evaluation and treatment. Current BMI is Body mass index is 34.54 kg/m.[name] Heather Morales has been struggling with her weight for many years and has been unsuccessful in either losing weight, maintaining weight loss, or reaching her healthy weight goal.     Heather Morales attended our information session and states she is currently in the action stage of change and ready to dedicate time achieving and maintaining a healthier weight. Heather Morales is interested in becoming our patient and working on intensive lifestyle modifications including (but not limited to) diet, exercise and weight loss.    Heather Morales states she thinks her family will eat healthier with  her her desired weight loss is to wear a size 12 she started gaining weight 15 years ago her heaviest weight ever was 225 lbs she is a picky eater and doesn't like to eat healthier foods  she has significant food cravings issues  she snacks frequently in the evenings she wakes up frquently in the middle of the night to eat she skips meals frequently she is frequently drinking liquids with calories she frequently makes poor food choices she frequently eats larger portions than normal  she struggles with emotional eating    Fatigue Heather Morales feels her energy is lower than it should be. This has worsened with weight gain and has not worsened recently. Heather Morales admits to daytime somnolence and  admits to waking up still tired. Patient is at risk for obstructive sleep apnea. Patent has a history of  symptoms of daytime fatigue and morning headache. Patient generally gets 6 hours of sleep per night, and states they generally have nightime awakenings. Snoring is present. Apneic episodes are not present. Epworth Sleepiness Score is 3.  Dyspnea on exertion Heather Morales notes increasing shortness of breath with exercising and seems to be worsening over time with weight gain. She notes getting out of breath sooner with activity than she used to. This has not gotten worse recently. EKG-Normal sinus rhythm, RSR (nonspecific findings). Heather Morales denies orthopnea.  Pre-Diabetes Heather Morales has a diagnosis of pre-diabetes based on her elevated Hgb A1c and was diagnosed several years ago. She was informed this puts her at greater risk of developing diabetes. She is not taking metformin currently and continues to work on diet and exercise to decrease risk of diabetes.  At risk for diabetes Heather Morales is at higher than average risk for developing diabetes due to her obesity and pre-diabetes. She currently denies polyuria or polydipsia.  Hypertension Heather Morales is a 58 y.o. female with hypertension. Heather Morales was diagnosed years ago and was previously on medications, but she can't recall the name. She denies chest pain. She is working weight loss to help control her blood pressure with the goal of decreasing her risk of heart attack and stroke. Heather Morales's blood pressure is not currently controlled.  Depression with emotional eating behaviors Heather Morales is struggling with significant emotional eating across the spectrum. She notes cognition with emotional eating, and she is not able to control her compulsion of emotional eating. She is using food for comfort to  the extent that it is negatively impacting her health. She often snacks when she is not hungry. Heather Morales sometimes feels she is out of control and then feels guilty that she made poor food choices. She has been working on behavior modification techniques to help reduce her  emotional eating and has been somewhat successful. She shows no sign of suicidal or homicidal ideations.  Depression Screen Heather Morales's Food and Mood (modified PHQ-9) score was  Depression screen PHQ 2/9 10/31/2018  Decreased Interest 1  Down, Depressed, Hopeless 2  PHQ - 2 Score 3  Altered sleeping 2  Tired, decreased energy 1  Change in appetite 2  Feeling bad or failure about yourself  3  Trouble concentrating 0  Moving slowly or fidgety/restless 1  Suicidal thoughts 0  PHQ-9 Score 12    ASSESSMENT AND PLAN:  Other fatigue - Plan: EKG 12-Lead, Vitamin B12, CBC With Differential, Folate, T3, T4, free, TSH, VITAMIN D 25 Hydroxy (Vit-D Deficiency, Fractures)  Shortness of breath on exertion - Plan: Lipid Panel With LDL/HDL Ratio  Prediabetes - Plan: Comprehensive metabolic panel, Hemoglobin A1c, Insulin, random  Essential hypertension  Other depression - with emotional eating  At risk for diabetes mellitus  Class 1 obesity with serious comorbidity and body mass index (BMI) of 34.0 to 34.9 in adult, unspecified obesity type  PLAN:  Fatigue Heather Morales was informed that her fatigue may be related to obesity, depression or many other causes. Labs will be ordered, and in the meanwhile Heather Morales has agreed to work on diet, exercise and weight loss to help with fatigue. Proper sleep hygiene was discussed including the need for 7-8 hours of quality sleep each night. A sleep study was not ordered based on symptoms and Epworth score.  Dyspnea on exertion Heather Morales's shortness of breath appears to be obesity related and exercise induced. She has agreed to work on weight loss and gradually increase exercise to treat her exercise induced shortness of breath. If Heather Morales follows our instructions and loses weight without improvement of her shortness of breath, we will plan to refer to pulmonology. We will monitor this condition regularly. Heather Morales agrees to this plan.  Pre-Diabetes Heather Morales will continue to  work on weight loss, exercise, and decreasing simple carbohydrates in her diet to help decrease the risk of diabetes. We dicussed metformin including benefits and risks. She was informed that eating too many simple carbohydrates or too many calories at one sitting increases the likelihood of GI side effects. Annaleia declined metformin for now and a prescription was not written today. We will check Hgb A1c and insulin today. Poppi agrees to follow up with our clinic in 2 weeks as directed to monitor her progress.  Diabetes risk counselling Heather Morales was given extended (15 minutes) diabetes prevention counseling today. She is 58 y.o. female and has risk factors for diabetes including obesity and pre-diabetes. We discussed intensive lifestyle modifications today with an emphasis on weight loss as well as increasing exercise and decreasing simple carbohydrates in her diet.  Hypertension We discussed sodium restriction, working on healthy weight loss, and a regular exercise program as the means to achieve improved blood pressure control. Heather Morales agreed with this plan and agreed to follow up as directed. We will continue to monitor her blood pressure as well as her progress with the above lifestyle modifications. She will continue to watch for signs of hypotension as she continues her lifestyle modifications. EKG eas done, and we will check CMP and FLP today. Heather Morales agrees to follow up  with our clinic in 2 weeks.  Depression with Emotional Eating Behaviors We discussed behavior modification techniques today to help Heather Morales deal with her emotional eating and depression. We will refer to [name], our bariatric psychology. Heather Morales agrees to follow up with our clinic in 2 weeks.  Depression Screen Heather Morales had a moderately positive depression screening. Depression is commonly associated with obesity and often results in emotional eating behaviors. We will monitor this closely and work on CBT to help improve the  non-hunger eating patterns. Referral to Psychology may be required if no improvement is seen as she continues in our clinic.  Obesity Heather Morales is currently in the action stage of change and her goal is to continue with weight loss efforts. I recommend Heather Morales begin the structured treatment plan as follows:  She has agreed to follow the Category 3 plan Heather Morales has been instructed to eventually work up to a goal of 150 minutes of combined cardio and strengthening exercise per week for weight loss and overall health benefits. We discussed the following Behavioral Modification Strategies today: increasing lean protein intake, increasing vegetables, work on meal planning and easy cooking plans, and planning for success   She was informed of the importance of frequent follow up visits to maximize her success with intensive lifestyle modifications for her multiple health conditions. She was informed we would discuss her lab results at her next visit unless there is a critical issue that needs to be addressed sooner. Tarren agreed to keep her next visit at the agreed upon time to discuss these results.  ALLERGIES: Allergies  Allergen Reactions  . Penicillins     MEDICATIONS: Current Outpatient Medications on File Prior to Visit  Medication Sig Dispense Refill  . albuterol (PROVENTIL HFA;VENTOLIN HFA) 108 (90 Base) MCG/ACT inhaler Inhale into the lungs every 6 (six) hours as needed for wheezing or shortness of breath.    [name] buPROPion (WELLBUTRIN XL) 300 MG 24 hr tablet TAKE 1 TABLET BY MOUTH EVERY DAY IN THE MORNING  1  . diphenhydrAMINE HCl (BENADRYL ALLERGY PO) Take 15 mg by mouth daily.    . fluticasone (FLONASE) 50 MCG/ACT nasal spray Place into both nostrils daily.    [name] HYDROcodone-acetaminophen (NORCO/VICODIN) 5-325 MG tablet Take 1 tablet by mouth every 4 (four) hours as needed. 20 tablet 0  . Lansoprazole (PREVACID PO) Take by mouth.    . levocetirizine (XYZAL) 5 MG tablet Take 5 mg by mouth  every evening.    . montelukast (SINGULAIR) 10 MG tablet Take 10 mg by mouth at bedtime.    . Vitamin D, Ergocalciferol, 2000 units CAPS Take by mouth.     No current facility-administered medications on file prior to visit.     PAST MEDICAL HISTORY: Past Medical History:  Diagnosis Date  . Anxiety   . Asthma   . Environmental allergies   . GERD (gastroesophageal reflux disease)   . High blood pressure   . Prediabetes   . Shoulder injury   . Sleep apnea   . SOB (shortness of breath)   . Swelling     PAST SURGICAL HISTORY: Past Surgical History:  Procedure Laterality Date  . CERVICAL CONIZATION W/BX  2000  . FOOT SURGERY      SOCIAL HISTORY: Social History   Tobacco Use  . Smoking status: Former Research scientist (life sciences)  . Smokeless tobacco: Never Used  Substance Use Topics  . Alcohol use: Yes  . Drug use: Never    FAMILY HISTORY: Family History  Problem  Relation Age of Onset  . Hypertension Mother   . Hyperlipidemia Mother   . Depression Mother   . Anxiety disorder Mother   . Obesity Father   . Eating disorder Father   . Sleep apnea Father   . Anxiety disorder Father     ROS: Review of Systems  Constitutional: Positive for malaise/fatigue. Negative for weight loss.       + Trouble sleeping  HENT: Positive for sinus pain.        + Decreased hearing + Nasal stuffiness + Nasal discharge + Hay fever  Eyes:       + Wear glasses or contacts  Respiratory: Positive for cough, shortness of breath (with exertion) and wheezing (Asthma).   Cardiovascular: Negative for chest pain and orthopnea.  Gastrointestinal: Positive for heartburn.  Genitourinary: Negative for frequency.  Musculoskeletal: Positive for back pain.       + Neck stiffness  Skin:       + Dryness  Neurological: Positive for headaches.  Endo/Heme/Allergies: Negative for polydipsia.       + Heat/cold intolerance  Psychiatric/Behavioral: Positive for depression. Negative for suicidal ideas.       + Stress     PHYSICAL EXAM: Blood pressure 132/83, pulse 72, temperature 97.8 F (36.6 C), temperature source Oral, height 5\' 6"  (1.676 m), weight 214 lb (97.1 kg), SpO2 98 %. Body mass index is 34.54 kg/m. Physical Exam Vitals signs reviewed.  Constitutional:      Appearance: Normal appearance. She is obese.  HENT:     Head: Normocephalic and atraumatic.     Nose: Nose normal.  Eyes:     General: No scleral icterus.    Extraocular Movements: Extraocular movements intact.  Neck:     Musculoskeletal: Normal range of motion and neck supple.     Comments: No thyromegaly present Cardiovascular:     Rate and Rhythm: Normal rate and regular rhythm.     Pulses: Normal pulses.     Heart sounds: Normal heart sounds.  Pulmonary:     Effort: Pulmonary effort is normal. No respiratory distress.     Breath sounds: Normal breath sounds.  Abdominal:     Palpations: Abdomen is soft.     Tenderness: There is no abdominal tenderness.     Comments: + Obesity  Musculoskeletal: Normal range of motion.     Right lower leg: No edema.     Left lower leg: No edema.  Skin:    General: Skin is warm and dry.  Neurological:     Mental Status: She is alert and oriented to person, place, and time.     Coordination: Coordination normal.  Psychiatric:        Mood and Affect: Mood normal.        Behavior: Behavior normal.     RECENT LABS AND TESTS: BMET    Component Value Date/Time   NA 142 10/31/2018 1311   K 4.8 10/31/2018 1311   CL 103 10/31/2018 1311   CO2 21 10/31/2018 1311   GLUCOSE 85 10/31/2018 1311   BUN 11 10/31/2018 1311   CREATININE 0.87 10/31/2018 1311   CALCIUM 9.3 10/31/2018 1311   GFRNONAA 74 10/31/2018 1311   GFRAA 85 10/31/2018 1311   Lab Results  Component Value Date   HGBA1C 5.2 10/31/2018   Lab Results  Component Value Date   INSULIN 30.9 (H) 10/31/2018   CBC    Component Value Date/Time   WBC 6.6 10/31/2018 1311   RBC  4.64 10/31/2018 1311   HGB 15.0 10/31/2018 1311    HCT 43.6 10/31/2018 1311   MCV 94 10/31/2018 1311   MCH 32.3 10/31/2018 1311   MCHC 34.4 10/31/2018 1311   RDW 13.1 10/31/2018 1311   LYMPHSABS 2.7 10/31/2018 1311   EOSABS 0.3 10/31/2018 1311   BASOSABS 0.1 10/31/2018 1311   Iron/TIBC/Ferritin/ %Sat No results found for: IRON, TIBC, FERRITIN, IRONPCTSAT Lipid Panel     Component Value Date/Time   CHOL 186 10/31/2018 1311   TRIG 118 10/31/2018 1311   HDL 56 10/31/2018 1311   LDLCALC 106 (H) 10/31/2018 1311   Hepatic Function Panel     Component Value Date/Time   PROT 6.9 10/31/2018 1311   ALBUMIN 4.4 10/31/2018 1311   AST 19 10/31/2018 1311   ALT 33 (H) 10/31/2018 1311   ALKPHOS 107 10/31/2018 1311   BILITOT 0.4 10/31/2018 1311      Component Value Date/Time   TSH 1.480 10/31/2018 1311    ECG  shows NSR with a rate of 77 BPM INDIRECT CALORIMETER done today shows a VO2 of 286 and a REE of 1992.  Her calculated basal metabolic rate is 7591 thus her basal metabolic rate is better than expected.       OBESITY BEHAVIORAL INTERVENTION VISIT  Today's visit was # 1   Starting weight: 214 lbs Starting date: 10/31/2018 Today's weight : 214 lbs  Today's date: 10/31/2018 Total lbs lost to date: 0    ASK: We discussed the diagnosis of obesity with Heather Morales today and Nyoka agreed to give [name] permission to discuss obesity behavioral modification therapy today.  ASSESS: Heather Morales has the diagnosis of obesity and her BMI today is 34.56 Heather Morales is in the action stage of change   ADVISE: Serah was educated on the multiple health risks of obesity as well as the benefit of weight loss to improve her health. She was advised of the need for long term treatment and the importance of lifestyle modifications to improve her current health and to decrease her risk of future health problems.  AGREE: Multiple dietary modification options and treatment options were discussed and  Pantera agreed to follow the  recommendations documented in the above note.  ARRANGE: Jeselle was educated on the importance of frequent visits to treat obesity as outlined per CMS and USPSTF guidelines and agreed to schedule her next follow up appointment today.  I, Trixie Dredge, am acting as transcriptionist for Ilene Qua, MD   I have reviewed the above documentation for accuracy and completeness, and I agree with the above. - Ilene Qua, MD

## 2018-11-07 ENCOUNTER — Encounter (INDEPENDENT_AMBULATORY_CARE_PROVIDER_SITE_OTHER): Payer: Self-pay | Admitting: Family Medicine

## 2018-11-12 NOTE — Progress Notes (Signed)
Office: [phone]  /  Fax: [phone]    Date: November 14, 2018  Time Seen: 10:58am Duration: 48 minutes Provider: Glennie Isle, PsyD Type of Session: Intake for Individual Therapy  Type of Contact: Face-to-face  Informed Consent: The provider's role was explained to Heather Morales. The provider reviewed and discussed issues of confidentiality, privacy, and limits therein. In addition to verbal informed consent, written informed consent for psychological services was obtained from [facility] prior to the initial intake interview. Written consent included information concerning the practice, financial arrangements, and confidentiality and patients' rights. Since the clinic is not a 24/7 crisis center, mental health emergency resources were shared in the form of a handout, and the provider explained MyChart, e-mail, voicemail, and/or other messaging systems should be utilized only for non-emergency reasons. Heather Morales verbally acknowledged understanding of the aforementioned, and agreed to use mental health emergency resources discussed if needed. Moreover, Heather Morales agreed information may be shared with other [facility] providers as needed for coordination of care, and written consent was obtained.   Chief Complaint: Heather Morales was referred by Dr. Ilene Qua due to depression with emotional eating behaviors. Per the note for the visit with Dr. Ilene Qua on October 31, 2018, "Heather Morales is struggling with significant emotional eating across the spectrum. She notes cognition with emotional eating, and she is not able to control her compulsion of emotional eating. She is using food for comfort to the extent that it is negatively impacting her health. She often snacks when she is not hungry. Heather Morales sometimes feels she is out of control and then feels guilty that she made poor food choices. She has been working on behavior modification techniques to help reduce her  emotional eating and has been somewhat successful. She shows no sign of suicidal or homicidal ideations."  During today's appointment, Heather Morales reported, "I know I am an emotional eater." She indicated the last episode of emotional eating was 2 days ago due to work-related stressors resulting in her consuming a yogurt. She stated she tends to crave sweets; however, shared there has been a reduction in cravings since starting the prescribed meal plan. Following emotional eating, Heather Morales indicated she tends to feel guilty.  Heather Morales was asked to complete a questionnaire assessing various behaviors related to emotional eating. Heather Morales endorsed the following: experience food cravings on a regular basis, eat certain foods when you are anxious, stressed, depressed, or your feelings are hurt, use food to help you cope with emotional situations, find food is comforting to you, overeat when you are angry or upset, overeat when you are worried about something, overeat when you are angry at someone just to show them they cannot control you, eat to help you stay awake and eat as a reward.  HPI:  Per the note for the initial visit with Dr. Ilene Qua on October 31, 2018, Heather Morales started gaining weight 15 years ago, and her heaviest weight ever was 225 pounds. During the initial appointment with Dr. Ilene Qua, Heather Morales reported experiencing the following: significant food cravings issues , snacking frequently in the evenings, frequently drinking liquids with calories, frequently making poor food choices, frequently eating larger portions than normal , struggling with emotional eating, skipping meals frequently and waking up frquently in the middle of the night to eat. Heather Morales described herself as a picky eater and she does not like to eat healthier foods. During today's appointment, Heather Morales reported a history of weight concerns. She indicated the onset of emotional eating was in  adulthood. She denied history of  purging and engagement in other compensatory strategy and has never been diagnosed with an eating disorder.  Mental Status Examination: Heather Morales arrived on time for the appointment. She presented as appropriately dressed and groomed. Heather Morales appeared her stated age and demonstrated adequate orientation to time, place, person, and purpose of the appointment. She also demonstrated appropriate eye contact. No psychomotor abnormalities or behavioral peculiarities noted. Her mood was euthymic with congruent affect. Her thought processes were logical, linear, and goal-directed. No hallucinations, delusions, bizarre thinking or behavior reported or observed. Judgment, insight, and impulse control appeared to be grossly intact. There was no evidence of paraphasias (i.e., errors in speech, gross mispronunciations, and word substitutions), repetition deficits, or disturbances in volume or prosody (i.e., rhythm and intonation). There was no evidence of attention or memory impairments. Heather Morales denied current suicidal and homicidal ideation, plan, and intent.   The Montreal Cognitive Assessment (MoCA) was administered. The MoCA assesses different cognitive domains: attention and concentration, executive functions, memory, language, visuoconstructional skills, conceptual thinking, calculations, and orientation. Heather Morales received 25 out of 30 points possible on the MoCA. It is important to note that symptoms of anxiety and depression, including stress can impact results on this measure. During today's clinical interview, Heather Morales reported experiencing stress secondary to work. Therefore, results should be interpreted with caution. One point was lost on the abstraction task requiring Heather Morales to identify the similarities between 2 words. In addition, 4 points were lost on the delayed recall task as Heather Morales recalled 1 out of 5 words after a short delay. With category cues, she recalled an additional word. With additional multiple choice  cues, she recalled the remaining 3 words.  Family & Psychosocial History: Heather Morales reported she has been married for 40 years and has a daughter, age 69. 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 She shared her social support consists of her husband, sister-in-law, parents, daughter, coworkers, and friends.  She explained she was raised [place] and considers herself to be "very spiritual."  Medical History:  Past Medical History:  Diagnosis Date  . Anxiety   . Asthma   . Environmental allergies   . GERD (gastroesophageal reflux disease)   . High blood pressure   . Prediabetes   . Shoulder injury   . Sleep apnea   . SOB (shortness of breath)   . Swelling    Past Surgical History:  Procedure Laterality Date  . CERVICAL CONIZATION W/BX  2000  . FOOT SURGERY     Current Outpatient Medications on File Prior to Visit  Medication Sig Dispense Refill  . albuterol (PROVENTIL HFA;VENTOLIN HFA) 108 (90 Base) MCG/ACT inhaler Inhale into the lungs every 6 (six) hours as needed for wheezing or shortness of breath.    [name] buPROPion (WELLBUTRIN XL) 300 MG 24 hr tablet TAKE 1 TABLET BY MOUTH EVERY DAY IN THE MORNING  1  . diphenhydrAMINE HCl (BENADRYL ALLERGY PO) Take 15 mg by mouth daily.    . fluticasone (FLONASE) 50 MCG/ACT nasal spray Place into both nostrils daily.    [name] HYDROcodone-acetaminophen (NORCO/VICODIN) 5-325 MG tablet Take 1 tablet by mouth every 4 (four) hours as needed. 20 tablet 0  . Lansoprazole (PREVACID PO) Take by mouth.    . levocetirizine (XYZAL) 5 MG tablet Take 5 mg by mouth every evening.    . montelukast (SINGULAIR) 10 MG tablet Take 10 mg by mouth at  bedtime.    . Vitamin D, Ergocalciferol, 2000 units CAPS Take by mouth.     No current facility-administered medications on file prior to visit.   Heather Morales denied a history of head injuries and loss of consciousness.     Mental Health History: Heather Morales first received therapeutic services in her early 74s in the form of individual therapy for 6 months due to ongoing stressors. Approximately 26 years ago, she initiated marriage counseling with her first husband and it was later switched to individual therapy. She explained that services were initiated as her husband was sick. In addition, 20 years ago she initiated marriage therapy with her current husband. Heather Morales reinitiated individual therapy approximately 17 years ago due to marital stressors. She denied ever meeting with a psychiatrist and has never been hospitalized for psychiatric reasons. Currently, Heather Morales noted her primary care provider prescribes Wellbutrin and she described it as "helpful." Regarding family history of mental health related concerns, she indicated her mother, father, and sister have suffered from depression. In addition, her daughter has been diagnosed with attention deficit disorder. Heather Morales denied a trauma history, including psychological, physical  and sexual abuse, as well as neglect.   Heather Morales describes her typical mood as "good." She indicated experiencing difficulty staying asleep and noted she averages 6 to 7 hours of sleep a night. She also endorsed experiencing fatigue and worry related to her daughter, job, and finances. As noted above, she experiences guilt secondary to emotional eating. Heather Morales denied experience depressed mood, hopelessness, appetite issues, decreased self-esteem, attention concentration issues, memory concerns, feeling fidgety or restless, social withdrawal, irritability, obsessions and compulsions, hallucinations delusions, mania, and angry outbursts. She also did not endorse any substance use. In addition, she denied legal history. Moreover, she denied history of and current suicidal ideation, plan, and intent; history of and current homicidal ideation, plan, and intent; and history of and current engagement in  self-harm.  The following strengths were reported by Heather Morales: positive, active, people person, responsible, and organized. The following strengths were observed by this provider: ability to express thoughts and feelings during the therapeutic session, ability to establish and benefit from a therapeutic relationship, ability to learn and practice coping skills, willingness to work toward established goal(s) with the clinic and ability to engage in reciprocal conversation.  Structured Assessment Results: The Patient Health Questionnaire-9 (PHQ-9) is a self-report measure that assesses symptoms and severity of depression over the course of the last two weeks. Carel obtained a score of 2 suggesting minimal depression. Lisett finds the endorsed symptoms to be not difficult at all. Depression screen [facility] 2/9 11/14/2018  Decreased Interest 0  Down, Depressed, Hopeless 0  PHQ - 2 Score 0  Altered sleeping 1  Tired, decreased energy 1  Change in appetite 0  Feeling bad or failure about yourself  0  Trouble concentrating 0  Moving slowly or fidgety/restless 0  Suicidal thoughts 0  PHQ-9 Score 2   The Generalized Anxiety Disorder-7 (GAD-7) is a brief self-report measure that assesses symptoms of anxiety over the course of the last two weeks. Amrie obtained a score of 4 suggesting minimal anxiety.  GAD 7 : Generalized Anxiety Score 11/14/2018  Nervous, Anxious, on Edge 1  Control/stop worrying 1  Worry too much - different things 1  Trouble relaxing 0  Restless 0  Easily annoyed or irritable 0  Afraid - awful might happen 1  Total GAD 7 Score 4  Anxiety Difficulty Not difficult at all    Interventions: A chart  review was conducted prior to the clinical intake interview. The MoCA, PHQ-9, and GAD-7 were administered and a clinical intake interview was completed. In addition, Cinzia was asked to complete a Mood and Food questionnaire to assess various behaviors related to emotional eating. Throughout  session, empathic reflections and validation was provided. Continuing treatment with this provider was discussed and a treatment goal was established. Psychoeducation regarding emotional versus physical hunger was provided. Janin was given a handout to utilize between now and the next appointment to increase awareness of hunger patterns and subsequent eating.   Provisional DSM-5 Diagnosis: 311 (F32.8) Other Specified Depressive Disorder, Emotional Eating Behaviors  Plan: Kalianne appears able and willing to participate as evidenced by collaboration on a treatment goal, engagement in reciprocal conversation, and asking questions as needed for clarification. The next appointment will be scheduled in two weeks. The following treatment goal was established: decrease emotional eating. For the aforementioned goal, Adalei can benefit from biweekly individual therapy sessions that are brief in duration for approximately four to six sessions. The treatment modality will be individual therapeutic services, including an eclectic therapeutic approach utilizing techniques from Cognitive Behavioral Therapy, Patient Centered Therapy, Dialectical Behavior Therapy, Acceptance and Commitment Therapy, Interpersonal Therapy, and Cognitive Restructuring. Therapeutic approach will include various interventions as appropriate, such as validation, support, mindfulness, thought defusion, reframing, psychoeducation, values assessment, and role playing. This provider will regularly review the treatment plan and medical chart to keep informed of status changes. Ron expressed understanding and agreement with the initial treatment plan of care.

## 2018-11-14 ENCOUNTER — Ambulatory Visit (INDEPENDENT_AMBULATORY_CARE_PROVIDER_SITE_OTHER): Payer: 59 | Admitting: Psychology

## 2018-11-14 ENCOUNTER — Encounter (INDEPENDENT_AMBULATORY_CARE_PROVIDER_SITE_OTHER): Payer: Self-pay | Admitting: Family Medicine

## 2018-11-14 ENCOUNTER — Ambulatory Visit (INDEPENDENT_AMBULATORY_CARE_PROVIDER_SITE_OTHER): Payer: 59 | Admitting: Family Medicine

## 2018-11-14 ENCOUNTER — Other Ambulatory Visit: Payer: Self-pay | Admitting: Orthopedic Surgery

## 2018-11-14 VITALS — BP 138/84 | HR 82 | Ht 66.0 in | Wt 211.0 lb

## 2018-11-14 DIAGNOSIS — Z9189 Other specified personal risk factors, not elsewhere classified: Secondary | ICD-10-CM

## 2018-11-14 DIAGNOSIS — Z6834 Body mass index (BMI) 34.0-34.9, adult: Secondary | ICD-10-CM

## 2018-11-14 DIAGNOSIS — R7303 Prediabetes: Secondary | ICD-10-CM | POA: Diagnosis not present

## 2018-11-14 DIAGNOSIS — M25511 Pain in right shoulder: Secondary | ICD-10-CM

## 2018-11-14 DIAGNOSIS — E559 Vitamin D deficiency, unspecified: Secondary | ICD-10-CM

## 2018-11-14 DIAGNOSIS — F3289 Other specified depressive episodes: Secondary | ICD-10-CM | POA: Diagnosis not present

## 2018-11-14 DIAGNOSIS — E669 Obesity, unspecified: Secondary | ICD-10-CM

## 2018-11-15 NOTE — Progress Notes (Signed)
Office: [phone]  /  Fax: [phone]   HPI:   Chief Complaint: OBESITY Heather Morales is here to discuss her progress with her obesity treatment plan. She is on the Category 3 plan and is following her eating plan approximately 80 % of the time. She states she is exercising 0 minutes 0 times per week. Heather Morales found the meal plan to be very protein heavy. She did find she was not hungry. She is doing wine for snack calories. She notes the only thing she would change is adding butter.  Her weight is 211 lb (95.7 kg) today and has had a weight loss of 3 pounds over a period of 2 weeks since her last visit. She has lost 3 lbs since starting treatment with [name].  Vitamin D Deficiency Heather Morales has a diagnosis of vitamin D deficiency. She is on Vit D 2,000 IU every other day. She notes fatigue and denies nausea, vomiting or muscle weakness.  Pre-Diabetes Heather Morales has a diagnosis of pre-diabetes based on her elevated Hgb A1c of 5.2 and insulin of 30.9, and was informed this puts her at greater risk of developing diabetes. She is not taking metformin currently and continues to work on diet and exercise to decrease risk of diabetes. She denies nausea or hypoglycemia.  At risk for diabetes Heather Morales is at higher than average risk for developing diabetes due to her obesity and pre-diabetes. She currently denies polyuria or polydipsia.  Depression with emotional eating behaviors Heather Morales is struggling with emotional eating and using food for comfort to the extent that it is negatively impacting her health. She often snacks when she is not hungry. Heather Morales sometimes feels she is out of control and then feels guilty that she made poor food choices. She has been working on behavior modification techniques to help reduce her emotional eating and has been somewhat successful. She is seeing [name] and she shows no sign of suicidal or homicidal ideations.  Depression screen Heather Morales 2/9 11/14/2018 10/31/2018  Decreased Interest  0 1  Down, Depressed, Hopeless 0 2  PHQ - 2 Score 0 3  Altered sleeping 1 2  Tired, decreased energy 1 1  Change in appetite 0 2  Feeling bad or failure about yourself  0 3  Trouble concentrating 0 0  Moving slowly or fidgety/restless 0 1  Suicidal thoughts 0 0  PHQ-9 Score 2 12    ASSESSMENT AND PLAN:  Vitamin D deficiency  Prediabetes  Other depression - with emotional eating   At risk for diabetes mellitus  Class 1 obesity with serious comorbidity and body mass index (BMI) of 34.0 to 34.9 in adult, unspecified obesity type  PLAN:  Vitamin D Deficiency [name] was informed that low vitamin D levels contributes to fatigue and are associated with obesity, breast, and colon cancer. [name] agrees to increase Vit D to 2,000 IU daily, no prescription needed. She will follow up for routine testing of vitamin D, at least 2-3 times per year. She was informed of the risk of over-replacement of vitamin D and agrees to not increase her dose unless she discusses this with [name] first. [name] agrees to follow up with our clinic in 2 weeks.  Pre-Diabetes Heather Morales will continue Category 3 meal plan, and will continue to work on weight loss, exercise, and decreasing simple carbohydrates in her diet to help decrease the risk of diabetes. We dicussed metformin including benefits and risks. She was informed that eating too many simple carbohydrates or too many calories at one  sitting increases the likelihood of GI side effects. Rettie declined metformin for now and a prescription was not written today. We will retest Hgb A1c and insulin in 3 months. Heather Morales agrees to follow up with our clinic in 2 weeks as directed to monitor her progress.  Diabetes risk counselling Heather Morales was given extended (30 minutes) diabetes prevention counseling today. She is 58 y.o. female and has risk factors for diabetes including obesity and pre-diabetes. We discussed intensive lifestyle modifications today with an emphasis on  weight loss as well as increasing exercise and decreasing simple carbohydrates in her diet.  Depression with Emotional Eating Behaviors We discussed behavior modification techniques today to help Heather Morales deal with her emotional eating and depression. Heather Morales will follow up with [name] at previously scheduled appointment. Perian agrees to follow up with our clinic in 2 weeks.  Obesity Heather Morales is currently in the action stage of change. As such, her goal is to continue with weight loss efforts She has agreed to follow the Category 3 plan [name] has been instructed to work up to a goal of 150 minutes of combined cardio and strengthening exercise per week for weight loss and overall health benefits. We discussed the following Behavioral Modification Strategies today: increasing lean protein intake, increasing vegetables, work on meal planning and easy cooking plans, better snacking choices, and planning for success   Heather Morales has agreed to follow up with our clinic in 2 weeks. She was informed of the importance of frequent follow up visits to maximize her success with intensive lifestyle modifications for her multiple health conditions.  ALLERGIES: Allergies  Allergen Reactions  . Penicillins     MEDICATIONS: Current Outpatient Medications on File Prior to Visit  Medication Sig Dispense Refill  . albuterol (PROVENTIL HFA;VENTOLIN HFA) 108 (90 Base) MCG/ACT inhaler Inhale into the lungs every 6 (six) hours as needed for wheezing or shortness of breath.    [name] buPROPion (WELLBUTRIN XL) 300 MG 24 hr tablet TAKE 1 TABLET BY MOUTH EVERY DAY IN THE MORNING  1  . diphenhydrAMINE HCl (BENADRYL ALLERGY PO) Take 15 mg by mouth daily.    . fluticasone (FLONASE) 50 MCG/ACT nasal spray Place into both nostrils daily.    [name] HYDROcodone-acetaminophen (NORCO/VICODIN) 5-325 MG tablet Take 1 tablet by mouth every 4 (four) hours as needed. 20 tablet 0  . Lansoprazole (PREVACID PO) Take by mouth.    .  levocetirizine (XYZAL) 5 MG tablet Take 5 mg by mouth every evening.    . montelukast (SINGULAIR) 10 MG tablet Take 10 mg by mouth at bedtime.    . Vitamin D, Ergocalciferol, 2000 units CAPS Take by mouth.     No current facility-administered medications on file prior to visit.     PAST MEDICAL HISTORY: Past Medical History:  Diagnosis Date  . Anxiety   . Asthma   . Environmental allergies   . GERD (gastroesophageal reflux disease)   . High blood pressure   . Prediabetes   . Shoulder injury   . Sleep apnea   . SOB (shortness of breath)   . Swelling     PAST SURGICAL HISTORY: Past Surgical History:  Procedure Laterality Date  . CERVICAL CONIZATION W/BX  2000  . FOOT SURGERY      SOCIAL HISTORY: Social History   Tobacco Use  . Smoking status: Former Research scientist (life sciences)  . Smokeless tobacco: Never Used  Substance Use Topics  . Alcohol use: Yes  . Drug use: Never    FAMILY HISTORY: Family  History  Problem Relation Age of Onset  . Hypertension Mother   . Hyperlipidemia Mother   . Depression Mother   . Anxiety disorder Mother   . Obesity Father   . Eating disorder Father   . Sleep apnea Father   . Anxiety disorder Father     ROS: Review of Systems  Constitutional: Positive for malaise/fatigue and weight loss.  Gastrointestinal: Negative for nausea and vomiting.  Genitourinary: Negative for frequency.  Musculoskeletal:       Negative muscle weakness  Endo/Heme/Allergies: Negative for polydipsia.       Negative hypoglycemia  Psychiatric/Behavioral: Positive for depression. Negative for suicidal ideas.    PHYSICAL EXAM: Blood pressure 138/84, pulse 82, height 5\' 6"  (1.676 m), weight 211 lb (95.7 kg), SpO2 96 %. Body mass index is 34.06 kg/m. Physical Exam Vitals signs reviewed.  Constitutional:      Appearance: Normal appearance. She is obese.  Cardiovascular:     Rate and Rhythm: Normal rate.     Pulses: Normal pulses.  Pulmonary:     Effort: Pulmonary effort  is normal.     Breath sounds: Normal breath sounds.  Musculoskeletal: Normal range of motion.  Skin:    General: Skin is warm and dry.  Neurological:     Mental Status: She is alert and oriented to person, place, and time.  Psychiatric:        Mood and Affect: Mood normal.        Behavior: Behavior normal.     RECENT LABS AND TESTS: BMET    Component Value Date/Time   NA 142 10/31/2018 1311   K 4.8 10/31/2018 1311   CL 103 10/31/2018 1311   CO2 21 10/31/2018 1311   GLUCOSE 85 10/31/2018 1311   BUN 11 10/31/2018 1311   CREATININE 0.87 10/31/2018 1311   CALCIUM 9.3 10/31/2018 1311   GFRNONAA 74 10/31/2018 1311   GFRAA 85 10/31/2018 1311   Lab Results  Component Value Date   HGBA1C 5.2 10/31/2018   Lab Results  Component Value Date   INSULIN 30.9 (H) 10/31/2018   CBC    Component Value Date/Time   WBC 6.6 10/31/2018 1311   RBC 4.64 10/31/2018 1311   HGB 15.0 10/31/2018 1311   HCT 43.6 10/31/2018 1311   MCV 94 10/31/2018 1311   MCH 32.3 10/31/2018 1311   MCHC 34.4 10/31/2018 1311   RDW 13.1 10/31/2018 1311   LYMPHSABS 2.7 10/31/2018 1311   EOSABS 0.3 10/31/2018 1311   BASOSABS 0.1 10/31/2018 1311   Iron/TIBC/Ferritin/ %Sat No results found for: IRON, TIBC, FERRITIN, IRONPCTSAT Lipid Panel     Component Value Date/Time   CHOL 186 10/31/2018 1311   TRIG 118 10/31/2018 1311   HDL 56 10/31/2018 1311   LDLCALC 106 (H) 10/31/2018 1311   Hepatic Function Panel     Component Value Date/Time   PROT 6.9 10/31/2018 1311   ALBUMIN 4.4 10/31/2018 1311   AST 19 10/31/2018 1311   ALT 33 (H) 10/31/2018 1311   ALKPHOS 107 10/31/2018 1311   BILITOT 0.4 10/31/2018 1311      Component Value Date/Time   TSH 1.480 10/31/2018 1311      OBESITY BEHAVIORAL INTERVENTION VISIT  Today's visit was # 2   Starting weight: 214 lbs Starting date: 10/31/2018 Today's weight : 211 lbs Today's date: 11/14/2018 Total lbs lost to date: 3    ASK: We discussed the diagnosis  of obesity with [place] today and Tirza agreed to  give [name] permission to discuss obesity behavioral modification therapy today.  ASSESS: Adrina has the diagnosis of obesity and her BMI today is 34.07 Kamarie is in the action stage of change   ADVISE: Valinda was educated on the multiple health risks of obesity as well as the benefit of weight loss to improve her health. She was advised of the need for long term treatment and the importance of lifestyle modifications to improve her current health and to decrease her risk of future health problems.  AGREE: Multiple dietary modification options and treatment options were discussed and  Trinaty agreed to follow the recommendations documented in the above note.  ARRANGE: Kenlie was educated on the importance of frequent visits to treat obesity as outlined per CMS and USPSTF guidelines and agreed to schedule her next follow up appointment today.  I, Trixie Dredge, am acting as transcriptionist for Ilene Qua, MD  I have reviewed the above documentation for accuracy and completeness, and I agree with the above. - Ilene Qua, MD

## 2018-11-20 ENCOUNTER — Ambulatory Visit
Admission: RE | Admit: 2018-11-20 | Discharge: 2018-11-20 | Disposition: A | Discharge status: Home / Self Care | Payer: 59 | Source: Ambulatory Visit | Attending: Orthopedic Surgery | Admitting: Orthopedic Surgery

## 2018-11-20 DIAGNOSIS — M25511 Pain in right shoulder: Secondary | ICD-10-CM

## 2018-11-26 ENCOUNTER — Ambulatory Visit (INDEPENDENT_AMBULATORY_CARE_PROVIDER_SITE_OTHER): Payer: Self-pay | Admitting: Psychology

## 2018-12-03 ENCOUNTER — Ambulatory Visit (INDEPENDENT_AMBULATORY_CARE_PROVIDER_SITE_OTHER): Payer: 59 | Admitting: Family Medicine

## 2018-12-03 ENCOUNTER — Encounter (INDEPENDENT_AMBULATORY_CARE_PROVIDER_SITE_OTHER): Payer: Self-pay | Admitting: Family Medicine

## 2018-12-03 VITALS — BP 144/85 | HR 80 | Temp 97.8°F | Ht 66.0 in | Wt 207.0 lb

## 2018-12-03 DIAGNOSIS — R030 Elevated blood-pressure reading, without diagnosis of hypertension: Secondary | ICD-10-CM

## 2018-12-03 DIAGNOSIS — E8881 Metabolic syndrome: Secondary | ICD-10-CM | POA: Diagnosis not present

## 2018-12-03 DIAGNOSIS — Z6833 Body mass index (BMI) 33.0-33.9, adult: Secondary | ICD-10-CM | POA: Diagnosis not present

## 2018-12-03 DIAGNOSIS — E559 Vitamin D deficiency, unspecified: Secondary | ICD-10-CM | POA: Diagnosis not present

## 2018-12-03 DIAGNOSIS — E669 Obesity, unspecified: Secondary | ICD-10-CM

## 2018-12-03 NOTE — Progress Notes (Signed)
Office: [phone]  /  Fax: [phone]    Date: December 04, 2018   Time Seen: 8:30am Duration: 34 minutes Provider: Glennie Isle, Psy.D. Type of Session: Individual Therapy  Type of Contact: Face-to-face  Session Content: Heather Morales is a 58 y.o. female presenting for a follow-up appointment to address the previously established treatment goal of decreasing emotional eating. The session was initiated with the administration of the PHQ-9 and GAD-7, as well as a brief check-in. Since the last appointment, Heather Morales shared she has been busy as well as stressed due to work. Regarding physical versus emotional hunger, Heather Morales noted she tracked it. She reported experiencing emotional hunger in the afternoon when at work during which she craves chocolate. She also noted having a desire to snack before bed. This was explored further, and Heather Morales was engaged in problem solving to ensure she is consuming all foods on the structured meal plan. Alternatives to bread were discussed, and Heather Morales was encouraged to measure her pours for alcoholic beverages, as she reported using all snack calories toward alcohol. Heather Morales agreed. Moreover, psychoeducation regarding triggers for emotional eating was provided. Avelyn was provided a handout, and encouraged to utilize the handout between now and the next appointment to increase awareness of triggers and frequency. Heather Morales agreed. This provider also discussed behavioral strategies for specific triggers, such as placing the utensil down when conversing to avoid mindless eating.  Derek was receptive to today's session as evidenced by openness to sharing, responsiveness to feedback, and willingness to identify triggers for emotional eating.  Mental Status Examination: Heather Morales arrived on time for the appointment. She presented as appropriately dressed and groomed. Heather Morales appeared her stated age and demonstrated adequate orientation to time, place, person, and purpose of the  appointment. She also demonstrated appropriate eye contact. No psychomotor abnormalities or behavioral peculiarities noted. Her mood was euthymic with congruent affect. Her thought processes were logical, linear, and goal-directed. No hallucinations, delusions, bizarre thinking or behavior reported or observed. Judgment, insight, and impulse control appeared to be grossly intact. There was no evidence of paraphasias (i.e., errors in speech, gross mispronunciations, and word substitutions), repetition deficits, or disturbances in volume or prosody (i.e., rhythm and intonation). There was no evidence of attention or memory impairments. Heather Morales denied current suicidal and homicidal ideation, plan and intent.   Structured Assessment Results: The Patient Health Questionnaire-9 (PHQ-9) is a self-report measure that assesses symptoms and severity of depression over the course of the last two weeks. Heather Morales obtained a score of 1 suggesting minimal depression. Heather Morales finds the endorsed symptoms to be not difficult at all. Depression screen [facility] 2/9 12/04/2018  Decreased Interest 0  Down, Depressed, Hopeless 0  PHQ - 2 Score 0  Altered sleeping 1  Tired, decreased energy 0  Change in appetite 0  Feeling bad or failure about yourself  0  Trouble concentrating 0  Moving slowly or fidgety/restless 0  Suicidal thoughts 0  PHQ-9 Score 1   The Generalized Anxiety Disorder-7 (GAD-7) is a brief self-report measure that assesses symptoms of anxiety over the course of the last two weeks. Heather Morales obtained a score of 2 suggesting minimal anxiety. GAD 7 : Generalized Anxiety Score 12/04/2018  Nervous, Anxious, on Edge 1  Control/stop worrying 0  Worry too much - different things 1  Trouble relaxing 0  Restless 0  Easily annoyed or irritable 0  Afraid - awful might happen 0  Total GAD 7 Score 2  Anxiety Difficulty Not difficult at all   Interventions:  Administration  of PHQ-9 and GAD-7 for symptom monitoring Review  of content from the previous session Empathic reflections and validation Problem solving Psychoeducation regarding triggers for emotional eating Positive reinforcement Rapport building Brief chart review  DSM-5 Diagnosis: 311 (F32.8) Other Specified Depressive Disorder, Emotional Eating Behaviors  Treatment Goal & Progress: During the initial appointment with this provider, the following treatment goal was established: decrease emotional eating. Progress is limited, as Heather Morales has just begun treatment with this provider; however, she is receptive to the interaction and interventions and rapport is being established. Nevertheless, Heather Morales has demonstrated progress in her goal as evidenced by increased awareness of hunger patterns.  Plan: Heather Morales continues to appear able and willing to participate as evidenced by engagement in reciprocal conversation, and asking questions for clarification as appropriate. The next appointment will be scheduled in two weeks. The next session will focus on reviewing triggers for emotional eating, and the introduction of mindfulness.

## 2018-12-04 ENCOUNTER — Ambulatory Visit (INDEPENDENT_AMBULATORY_CARE_PROVIDER_SITE_OTHER): Payer: 59 | Admitting: Psychology

## 2018-12-04 DIAGNOSIS — F3289 Other specified depressive episodes: Secondary | ICD-10-CM | POA: Diagnosis not present

## 2018-12-04 NOTE — Progress Notes (Signed)
Office: [phone]  /  Fax: [phone]   HPI:   Chief Complaint: OBESITY Heather Morales is here to discuss her progress with her obesity treatment plan. She is on the Category 3 plan and is following her eating plan approximately 60% of the time. She states she is exercising 0 minutes 0 times per week. Heather Morales has been busy at work, reports not eating all the food on her plan, and is having to go out to eat (mostly work related). She is getting most of her food in especially at breakfast and dinner. She reports work is calming down and she will be going on a business trip at the end of March. Her weight is 207 lb (93.9 kg) today and has had a weight loss of 4 pounds over a period of 3 weeks since her last visit. She has lost 7 lbs since starting treatment with [name].  Vitamin D deficiency Heather Morales has a diagnosis of Vitamin D deficiency. She is currently taking OTC Vit D and denies nausea, vomiting or muscle weakness. She reports positive fatigue.  Insulin Resistance Heather Morales has a diagnosis of insulin resistance based on her elevated fasting insulin level >5. Although Heather Morales's blood glucose readings are still under good control, insulin resistance puts her at greater risk of metabolic syndrome and diabetes. Heather Morales reports minimal carb cravings and denies overindulgence. She is not taking metformin currently and continues to work on diet and exercise to decrease risk of diabetes.  Elevated Blood Pressure Heather Morales reports having tried medications in the past, possibly lisinopril (?). She denies chest pain, chest pressure, or headache.  ASSESSMENT AND PLAN:  Vitamin D deficiency  Insulin resistance  Elevated blood pressure reading  Class 1 obesity with serious comorbidity and body mass index (BMI) of 33.0 to 33.9 in adult, unspecified obesity type  PLAN:  Vitamin D Deficiency Heather Morales was informed that low Vitamin D levels contributes to fatigue and are associated with obesity, breast, and colon  cancer. She agrees to continue to take OTC Vit D and will follow-up for routine testing of Vitamin D, at least 2-3 times per year. She was informed of the risk of over-replacement of Vitamin D and agrees to not increase her dose unless she discusses this with [name] first. Heather Morales agrees to follow-up with our clinic in 2 weeks.  Insulin Resistance Heather Morales will continue to work on weight loss, exercise, and decreasing simple carbohydrates in her diet to help decrease the risk of diabetes. We dicussed metformin including benefits and risks. She was informed that eating too many simple carbohydrates or too many calories at one sitting increases the likelihood of GI side effects. We will repeat labs in early May and Heather Morales agrees to follow-up with our clinic in 2 weeks.   Elevated Blood Pressure Heather Morales was advised that we will follow-up her elevated blood pressure at her next appointment.  I spent > than 50% of the 25 minute visit on counseling as documented in the note.  Obesity Heather Morales is currently in the action stage of change. As such, her goal is to continue with weight loss efforts. She has agreed to follow the Category 3 plan. Heather Morales has been instructed to work up to a goal of 150 minutes of combined cardio and strengthening exercise per week for weight loss and overall health benefits. We discussed the following Behavioral Modification Strategies today: increasing lean protein intake, increasing vegetables, work on meal planning and easy cooking plans, and planning for success.  Heather Morales has agreed to follow-up with  our clinic in 2 weeks. She was informed of the importance of frequent follow up visits to maximize her success with intensive lifestyle modifications for her multiple health conditions.  ALLERGIES: Allergies  Allergen Reactions  . Penicillins     MEDICATIONS: Current Outpatient Medications on File Prior to Visit  Medication Sig Dispense Refill  . albuterol (PROVENTIL HFA;VENTOLIN  HFA) 108 (90 Base) MCG/ACT inhaler Inhale into the lungs every 6 (six) hours as needed for wheezing or shortness of breath.    [name] buPROPion (WELLBUTRIN XL) 300 MG 24 hr tablet TAKE 1 TABLET BY MOUTH EVERY DAY IN THE MORNING  1  . diphenhydrAMINE HCl (BENADRYL ALLERGY PO) Take 15 mg by mouth daily.    . fluticasone (FLONASE) 50 MCG/ACT nasal spray Place into both nostrils daily.    [name] HYDROcodone-acetaminophen (NORCO/VICODIN) 5-325 MG tablet Take 1 tablet by mouth every 4 (four) hours as needed. 20 tablet 0  . Lansoprazole (PREVACID PO) Take by mouth.    . levocetirizine (XYZAL) 5 MG tablet Take 5 mg by mouth every evening.    . montelukast (SINGULAIR) 10 MG tablet Take 10 mg by mouth at bedtime.    . Vitamin D, Ergocalciferol, 2000 units CAPS Take by mouth.     No current facility-administered medications on file prior to visit.     PAST MEDICAL HISTORY: Past Medical History:  Diagnosis Date  . Anxiety   . Asthma   . Environmental allergies   . GERD (gastroesophageal reflux disease)   . High blood pressure   . Prediabetes   . Shoulder injury   . Sleep apnea   . SOB (shortness of breath)   . Swelling     PAST SURGICAL HISTORY: Past Surgical History:  Procedure Laterality Date  . CERVICAL CONIZATION W/BX  2000  . FOOT SURGERY      SOCIAL HISTORY: Social History   Tobacco Use  . Smoking status: Former Research scientist (life sciences)  . Smokeless tobacco: Never Used  Substance Use Topics  . Alcohol use: Yes  . Drug use: Never    FAMILY HISTORY: Family History  Problem Relation Age of Onset  . Hypertension Mother   . Hyperlipidemia Mother   . Depression Mother   . Anxiety disorder Mother   . Obesity Father   . Eating disorder Father   . Sleep apnea Father   . Anxiety disorder Father    ROS: Review of Systems  Constitutional: Positive for malaise/fatigue and weight loss.  Cardiovascular: Negative for chest pain.       Negative for chest pressure.  Gastrointestinal: Negative for nausea  and vomiting.  Musculoskeletal:       Negative for muscle weakness.  Neurological: Negative for headaches.  Endo/Heme/Allergies:       Negative for hypoglycemia.   PHYSICAL EXAM: Blood pressure (!) 144/85, pulse 80, temperature 97.8 F (36.6 C), temperature source Oral, height 5\' 6"  (1.676 m), weight 207 lb (93.9 kg), SpO2 97 %. Body mass index is 33.41 kg/m. Physical Exam Vitals signs reviewed.  Constitutional:      Appearance: Normal appearance. She is obese.  Cardiovascular:     Rate and Rhythm: Normal rate.     Pulses: Normal pulses.  Pulmonary:     Effort: Pulmonary effort is normal.     Breath sounds: Normal breath sounds.  Musculoskeletal: Normal range of motion.  Skin:    General: Skin is warm and dry.  Neurological:     Mental Status: She is alert and oriented to person,  place, and time.  Psychiatric:        Behavior: Behavior normal.   RECENT LABS AND TESTS: BMET    Component Value Date/Time   NA 142 10/31/2018 1311   K 4.8 10/31/2018 1311   CL 103 10/31/2018 1311   CO2 21 10/31/2018 1311   GLUCOSE 85 10/31/2018 1311   BUN 11 10/31/2018 1311   CREATININE 0.87 10/31/2018 1311   CALCIUM 9.3 10/31/2018 1311   GFRNONAA 74 10/31/2018 1311   GFRAA 85 10/31/2018 1311   Lab Results  Component Value Date   HGBA1C 5.2 10/31/2018   Lab Results  Component Value Date   INSULIN 30.9 (H) 10/31/2018   CBC    Component Value Date/Time   WBC 6.6 10/31/2018 1311   RBC 4.64 10/31/2018 1311   HGB 15.0 10/31/2018 1311   HCT 43.6 10/31/2018 1311   MCV 94 10/31/2018 1311   MCH 32.3 10/31/2018 1311   MCHC 34.4 10/31/2018 1311   RDW 13.1 10/31/2018 1311   LYMPHSABS 2.7 10/31/2018 1311   EOSABS 0.3 10/31/2018 1311   BASOSABS 0.1 10/31/2018 1311   Iron/TIBC/Ferritin/ %Sat No results found for: IRON, TIBC, FERRITIN, IRONPCTSAT Lipid Panel     Component Value Date/Time   CHOL 186 10/31/2018 1311   TRIG 118 10/31/2018 1311   HDL 56 10/31/2018 1311   LDLCALC 106  (H) 10/31/2018 1311   Hepatic Function Panel     Component Value Date/Time   PROT 6.9 10/31/2018 1311   ALBUMIN 4.4 10/31/2018 1311   AST 19 10/31/2018 1311   ALT 33 (H) 10/31/2018 1311   ALKPHOS 107 10/31/2018 1311   BILITOT 0.4 10/31/2018 1311      Component Value Date/Time   TSH 1.480 10/31/2018 1311    Ref. Range 10/31/2018 13:11  Vitamin D, 25-Hydroxy Latest Ref Range: 30.0 - 100.0 ng/mL 49.3    OBESITY BEHAVIORAL INTERVENTION VISIT  Today's visit was #3   Starting weight: 214 lbs Starting date: 10/31/2018 Today's weight: 207 lbs  Today's date: 12/03/2018 Total lbs lost to date: 7    12/03/2018  Height 5\' 6"  (1.676 m)  Weight 207 lb (93.9 kg)  BMI (Calculated) 33.43  BLOOD PRESSURE - SYSTOLIC 177  BLOOD PRESSURE - DIASTOLIC 85   Body Fat % 93.9 %  Total Body Water (lbs) 81 lbs   ASK: We discussed the diagnosis of obesity with Heather Morales today and Heather Morales agreed to give [name] permission to discuss obesity behavioral modification therapy today.  ASSESS: Heather Morales has the diagnosis of obesity and her BMI today is 33.43. Heather Morales is in the action stage of change.   ADVISE: Heather Morales was educated on the multiple health risks of obesity as well as the benefit of weight loss to improve her health. She was advised of the need for long term treatment and the importance of lifestyle modifications to improve her current health and to decrease her risk of future health problems.  AGREE: Multiple dietary modification options and treatment options were discussed and  Heather Morales agreed to follow the recommendations documented in the above note.  ARRANGE: Heather Morales was educated on the importance of frequent visits to treat obesity as outlined per CMS and USPSTF guidelines and agreed to schedule her next follow up appointment today.  I, Michaelene Song, am acting as transcriptionist for Ilene Qua, MD  I have reviewed the above documentation for accuracy and completeness, and I  agree with the above. - Ilene Qua, MD

## 2018-12-18 NOTE — Progress Notes (Signed)
Office: [phone]  /  Fax: [phone]    Date: 12/24/2018   Time Seen: 9:22am Duration: 34 minutes Provider: Glennie Isle, Psy.D. Type of Session: Individual Therapy  Type of Contact: Face-to-face  Session Content: Heather Morales is a 58 y.o. female presenting for a follow-up appointment to address the previously established treatment goal of decreasing emotional eating. The session was initiated with the administration of the PHQ-9 and GAD-7, as well as a brief check-in. Heather Morales reported she had surgery in her right arm secondary to a fall she suffered in November of 2019. She noted a plan to return to work on Thursday to help with COVID-19 preparations. Regarding eating, she shared, "I think I am doing okay." Heather Morales noted, "I still have anxiety with food." This was explored. She acknowledged using candy to help cope with stressors; however, she acknowledged greater awareness of portion control. Heather Morales acknowledged that now that she is "off meds," she is utilizing snack calories for alcohol. This provider and Heather Morales discussed measuring a pour of wine and utilizing remaining snack calories for chocolate as she deals with ongoing stressors at work. She was agreeable and indicated she will start measuring tonight. Psychoeducation regarding mindfulness was provided. A handout was provided to Heather Morales with further information regarding mindfulness, including exercises. This provider also explained the benefit of mindfulness as it relates to emotional eating. Stephana was encouraged to engage in the provided exercises between now and the next appointment with this provider. Heather Morales agreed. She was led through an exercise using her senses, and she noted, "I am actually surprised." She described the exercises as being helpful. She was also led through an additional mindfulness exercise, "A Taste of Mindfulness." Following the exercise, Asiana indicated, "I feel a lot more relaxed." She was provided a handout for that  exercise as well. Due to uncertainty related to the coronavirus, this provider and Heather Morales discussed using emergency resources shared during the first appointment should this provider's office be closed. Heather Morales shared she still has the handout for emergency resources, and was agreeable to using the resources if needed. Heather Morales was receptive to today's session as evidenced by openness to sharing, responsiveness to feedback, and engagement in mindfulness exercise.  Mental Status Examination: Heather Morales arrived early for the appointment. She presented as appropriately dressed and groomed. Heather Morales appeared her stated age and demonstrated adequate orientation to time, place, person, and purpose of the appointment. She also demonstrated appropriate eye contact. No psychomotor abnormalities or behavioral peculiarities noted. Her mood was euthymic with congruent affect. Her thought processes were logical, linear, and goal-directed. No hallucinations, delusions, bizarre thinking or behavior reported or observed. Judgment, insight, and impulse control appeared to be grossly intact. There was no evidence of paraphasias (i.e., errors in speech, gross mispronunciations, and word substitutions), repetition deficits, or disturbances in volume or prosody (i.e., rhythm and intonation). There was no evidence of attention or memory impairments. Heather Morales denied current suicidal and homicidal ideation, plan and intent.   Structured Assessment Results: The Patient Health Questionnaire-9 (PHQ-9) is a self-report measure that assesses symptoms and severity of depression over the course of the last two weeks. Heather Morales obtained a score of zero. Depression screen [facility] 2/9 12/24/2018  Decreased Interest 0  Down, Depressed, Hopeless 0  PHQ - 2 Score 0  Altered sleeping 0  Tired, decreased energy 0  Change in appetite 0  Feeling bad or failure about yourself  0  Trouble concentrating 0  Moving slowly or fidgety/restless 0  Suicidal thoughts 0    PHQ-9  Score 0   The Generalized Anxiety Disorder-7 (GAD-7) is a brief self-report measure that assesses symptoms of anxiety over the course of the last two weeks. Heather Morales obtained a score of 2 suggesting minimal anxiety. 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 Heather Morales has demonstrated progress in her goal as evidenced by increased awareness of hunger patterns and triggers for emotional eating. She demonstrated willingness to practice mindfulness.  Plan: Heather Morales continues to appear able and willing to participate as evidenced by engagement in reciprocal conversation, and asking questions for clarification as appropriate. The next appointment will be scheduled in two weeks. The next session will focus further on mindfulness.

## 2018-12-24 ENCOUNTER — Ambulatory Visit (INDEPENDENT_AMBULATORY_CARE_PROVIDER_SITE_OTHER): Payer: 59 | Admitting: Family Medicine

## 2018-12-24 ENCOUNTER — Encounter (INDEPENDENT_AMBULATORY_CARE_PROVIDER_SITE_OTHER): Payer: Self-pay | Admitting: Family Medicine

## 2018-12-24 ENCOUNTER — Encounter (INDEPENDENT_AMBULATORY_CARE_PROVIDER_SITE_OTHER): Payer: Self-pay

## 2018-12-24 ENCOUNTER — Other Ambulatory Visit: Payer: Self-pay

## 2018-12-24 ENCOUNTER — Ambulatory Visit (INDEPENDENT_AMBULATORY_CARE_PROVIDER_SITE_OTHER): Payer: 59 | Admitting: Psychology

## 2018-12-24 VITALS — BP 139/84 | HR 74 | Temp 97.6°F | Ht 66.0 in | Wt 205.0 lb

## 2018-12-24 DIAGNOSIS — F3289 Other specified depressive episodes: Secondary | ICD-10-CM

## 2018-12-24 DIAGNOSIS — Z6833 Body mass index (BMI) 33.0-33.9, adult: Secondary | ICD-10-CM | POA: Diagnosis not present

## 2018-12-24 DIAGNOSIS — R030 Elevated blood-pressure reading, without diagnosis of hypertension: Secondary | ICD-10-CM

## 2018-12-24 DIAGNOSIS — E669 Obesity, unspecified: Secondary | ICD-10-CM | POA: Diagnosis not present

## 2018-12-24 NOTE — Progress Notes (Signed)
Office: [phone]  /  Fax: [phone]   HPI:   Chief Complaint: OBESITY Heather Morales is here to discuss her progress with her obesity treatment plan. She is on the Category 3 plan and is following her eating plan approximately 80 % of the time. She states she is exercising 0 minutes 0 times per week. Heather Morales had a rotator cuff repair <1 week ago, and she is already off narcotics. She was not on the plan as strictly as she could secondary to surgery. She ate irregularly for 3 days of the past week. She is to start physical therapy in 2 days. She has food in the house.  Her weight is 205 lb (93 kg) today and has had a weight loss of 2 pounds over a period of 3 weeks since her last visit. She has lost 9 lbs since starting treatment with [name].  Elevated Blood Pressure Caidence blood pressure is elevated today and she notes pain. She is on ibuprofen and Tylenol. She is working weight loss to help control her blood pressure with the goal of decreasing her risk of heart attack and stroke. Karimah's blood pressure is not currently controlled.  Depression with emotional eating behaviors Talor is slightly elevated. Ninetta struggles with emotional eating and using food for comfort to the extent that it is negatively impacting her health. She often snacks when she is not hungry. Sherrilyn sometimes feels she is out of control and then feels guilty that she made poor food choices. She has been working on behavior modification techniques to help reduce her emotional eating and has been somewhat successful. She shows no sign of suicidal or homicidal ideations.  Depression screen [facility] 2/9 12/24/2018 12/04/2018 11/14/2018 10/31/2018  Decreased Interest 0 0 0 1  Down, Depressed, Hopeless 0 0 0 2  PHQ - 2 Score 0 0 0 3  Altered sleeping 0 1 1 2   Tired, decreased energy 0 0 1 1  Change in appetite 0 0 0 2  Feeling bad or failure about yourself  0 0 0 3  Trouble concentrating 0 0 0 0  Moving slowly or fidgety/restless 0 0  0 1  Suicidal thoughts 0 0 0 0  PHQ-9 Score 0 1 2 12     ASSESSMENT AND PLAN:  Elevated blood pressure reading  Other depression - with emotional eating  Class 1 obesity with serious comorbidity and body mass index (BMI) of 33.0 to 33.9 in adult, unspecified obesity type  PLAN:  Elevated Blood Pressure We discussed sodium restriction, working on healthy weight loss, and a regular exercise program as the means to achieve improved blood pressure control. Heather Morales agreed with this plan and agreed to follow up as directed. We will continue to monitor her blood pressure as well as her progress with the above lifestyle modifications. She will watch for signs of hypotension as she continues her lifestyle modifications. We will follow up on blood pressure at next appointment. Heather Morales agrees to follow up with our clinic in 2 weeks.  Depression with Emotional Eating Behaviors We discussed behavior modification techniques today to help Heather Morales deal with her emotional eating and depression. Heather Morales agrees to continue taking bupropion, and she agrees to follow up with our clinic in 2 weeks.  I spent > than 50% of the 15 minute visit on counseling as documented in the note.  Obesity Heather Morales is currently in the action stage of change. As such, her goal is to continue with weight loss efforts She has agreed to follow  the Category 3 plan Heather Morales has been instructed to work up to a goal of 150 minutes of combined cardio and strengthening exercise per week for weight loss and overall health benefits. We discussed the following Behavioral Modification Strategies today: increasing lean protein intake, increasing vegetables, work on meal planning and easy cooking plans, better snacking choices, and planning for success   Heather Morales has agreed to follow up with our clinic in 2 weeks. She was informed of the importance of frequent follow up visits to maximize her success with intensive lifestyle modifications for her  multiple health conditions.  ALLERGIES: Allergies  Allergen Reactions  . Penicillins     MEDICATIONS: Current Outpatient Medications on File Prior to Visit  Medication Sig Dispense Refill  . albuterol (PROVENTIL HFA;VENTOLIN HFA) 108 (90 Base) MCG/ACT inhaler Inhale into the lungs every 6 (six) hours as needed for wheezing or shortness of breath.    [name] buPROPion (WELLBUTRIN XL) 300 MG 24 hr tablet TAKE 1 TABLET BY MOUTH EVERY DAY IN THE MORNING  1  . diphenhydrAMINE HCl (BENADRYL ALLERGY PO) Take 15 mg by mouth daily.    . fluticasone (FLONASE) 50 MCG/ACT nasal spray Place into both nostrils daily.    [name] HYDROcodone-acetaminophen (NORCO/VICODIN) 5-325 MG tablet Take 1 tablet by mouth every 4 (four) hours as needed. 20 tablet 0  . Lansoprazole (PREVACID PO) Take by mouth.    . levocetirizine (XYZAL) 5 MG tablet Take 5 mg by mouth every evening.    . montelukast (SINGULAIR) 10 MG tablet Take 10 mg by mouth at bedtime.    . Vitamin D, Ergocalciferol, 2000 units CAPS Take by mouth.     No current facility-administered medications on file prior to visit.     PAST MEDICAL HISTORY: Past Medical History:  Diagnosis Date  . Anxiety   . Asthma   . Environmental allergies   . GERD (gastroesophageal reflux disease)   . High blood pressure   . Prediabetes   . Shoulder injury   . Sleep apnea   . SOB (shortness of breath)   . Swelling     PAST SURGICAL HISTORY: Past Surgical History:  Procedure Laterality Date  . CERVICAL CONIZATION W/BX  2000  . FOOT SURGERY      SOCIAL HISTORY: Social History   Tobacco Use  . Smoking status: Former Research scientist (life sciences)  . Smokeless tobacco: Never Used  Substance Use Topics  . Alcohol use: Yes  . Drug use: Never    FAMILY HISTORY: Family History  Problem Relation Age of Onset  . Hypertension Mother   . Hyperlipidemia Mother   . Depression Mother   . Anxiety disorder Mother   . Obesity Father   . Eating disorder Father   . Sleep apnea Father   .  Anxiety disorder Father     ROS: Review of Systems  Constitutional: Positive for weight loss.  Psychiatric/Behavioral: Positive for depression. Negative for suicidal ideas.    PHYSICAL EXAM: Blood pressure 139/84, pulse 74, temperature 97.6 F (36.4 C), temperature source Oral, height 5\' 6"  (1.676 m), weight 205 lb (93 kg), SpO2 99 %. Body mass index is 33.09 kg/m. Physical Exam Vitals signs reviewed.  Constitutional:      Appearance: Normal appearance. She is obese.  Cardiovascular:     Rate and Rhythm: Normal rate.     Pulses: Normal pulses.  Pulmonary:     Effort: Pulmonary effort is normal.     Breath sounds: Normal breath sounds.  Musculoskeletal: Normal range of  motion.  Skin:    General: Skin is warm and dry.  Neurological:     Mental Status: She is alert and oriented to person, place, and time.  Psychiatric:        Mood and Affect: Mood normal.        Behavior: Behavior normal.     RECENT LABS AND TESTS: BMET    Component Value Date/Time   NA 142 10/31/2018 1311   K 4.8 10/31/2018 1311   CL 103 10/31/2018 1311   CO2 21 10/31/2018 1311   GLUCOSE 85 10/31/2018 1311   BUN 11 10/31/2018 1311   CREATININE 0.87 10/31/2018 1311   CALCIUM 9.3 10/31/2018 1311   GFRNONAA 74 10/31/2018 1311   GFRAA 85 10/31/2018 1311   Lab Results  Component Value Date   HGBA1C 5.2 10/31/2018   Lab Results  Component Value Date   INSULIN 30.9 (H) 10/31/2018   CBC    Component Value Date/Time   WBC 6.6 10/31/2018 1311   RBC 4.64 10/31/2018 1311   HGB 15.0 10/31/2018 1311   HCT 43.6 10/31/2018 1311   MCV 94 10/31/2018 1311   MCH 32.3 10/31/2018 1311   MCHC 34.4 10/31/2018 1311   RDW 13.1 10/31/2018 1311   LYMPHSABS 2.7 10/31/2018 1311   EOSABS 0.3 10/31/2018 1311   BASOSABS 0.1 10/31/2018 1311   Iron/TIBC/Ferritin/ %Sat No results found for: IRON, TIBC, FERRITIN, IRONPCTSAT Lipid Panel     Component Value Date/Time   CHOL 186 10/31/2018 1311   TRIG 118  10/31/2018 1311   HDL 56 10/31/2018 1311   LDLCALC 106 (H) 10/31/2018 1311   Hepatic Function Panel     Component Value Date/Time   PROT 6.9 10/31/2018 1311   ALBUMIN 4.4 10/31/2018 1311   AST 19 10/31/2018 1311   ALT 33 (H) 10/31/2018 1311   ALKPHOS 107 10/31/2018 1311   BILITOT 0.4 10/31/2018 1311      Component Value Date/Time   TSH 1.480 10/31/2018 1311      OBESITY BEHAVIORAL INTERVENTION VISIT  Today's visit was # 4   Starting weight: 214 lbs Starting date: 10/31/2018 Today's weight : 205 lbs Today's date: 12/24/2018 Total lbs lost to date: 9    12/24/2018  Height 5\' 6"  (1.676 m)  Weight 205 lb (93 kg)  BMI (Calculated) 33.1  BLOOD PRESSURE - SYSTOLIC 681  BLOOD PRESSURE - DIASTOLIC 84   Body Fat % 15.7 %  Total Body Water (lbs) 77.4 lbs     ASK: We discussed the diagnosis of obesity with Heather Morales today and Heather Morales agreed to give [name] permission to discuss obesity behavioral modification therapy today.  ASSESS: Heather Morales has the diagnosis of obesity and her BMI today is 33.1 Heather Morales is in the action stage of change   ADVISE: Heather Morales was educated on the multiple health risks of obesity as well as the benefit of weight loss to improve her health. She was advised of the need for long term treatment and the importance of lifestyle modifications to improve her current health and to decrease her risk of future health problems.  AGREE: Multiple dietary modification options and treatment options were discussed and  Heather Morales agreed to follow the recommendations documented in the above note.  ARRANGE: Heather Morales was educated on the importance of frequent visits to treat obesity as outlined per CMS and USPSTF guidelines and agreed to schedule her next follow up appointment today.  Heather Morales, am acting as transcriptionist for Ilene Qua, MD  I have reviewed the above documentation for accuracy and completeness, and I agree with the above. - Ilene Qua, MD

## 2019-01-01 ENCOUNTER — Encounter (INDEPENDENT_AMBULATORY_CARE_PROVIDER_SITE_OTHER): Payer: Self-pay

## 2019-01-09 ENCOUNTER — Ambulatory Visit (INDEPENDENT_AMBULATORY_CARE_PROVIDER_SITE_OTHER): Payer: Self-pay | Admitting: Family Medicine

## 2019-01-09 ENCOUNTER — Ambulatory Visit (INDEPENDENT_AMBULATORY_CARE_PROVIDER_SITE_OTHER): Payer: Self-pay | Admitting: Psychology

## 2019-10-02 IMAGING — CR DG SHOULDER 2+V*R*
2 series · 2 of 2 positions shown · non-contrast
Comparison: Right shoulder radiographs performed earlier today at
[DATE] a.m.

CLINICAL DATA: Status post reduction of right humeral head
dislocation. Initial encounter.

EXAM:
RIGHT SHOULDER - 2+ VIEW

[x shoulder ap right (1 of 2)]
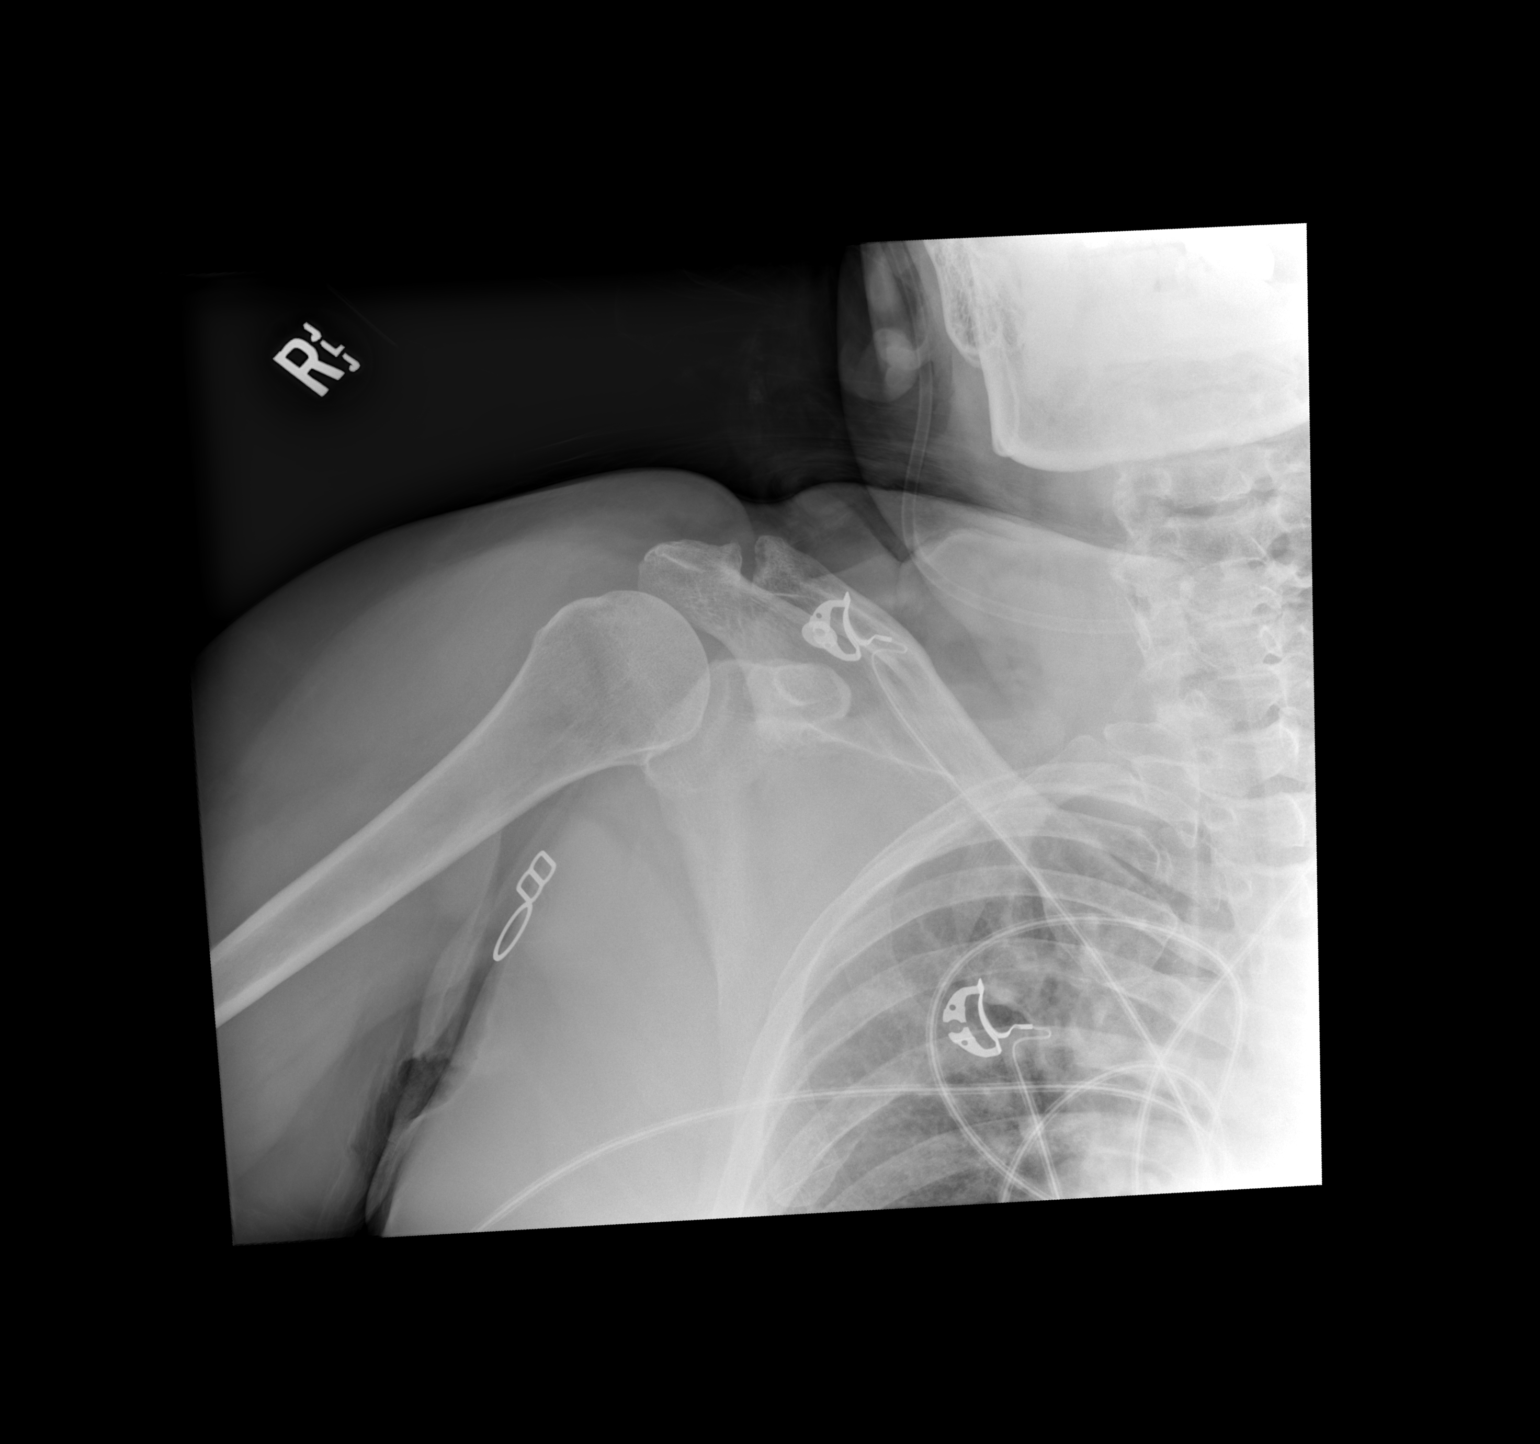

[x shoulder ap right (2 of 2)]
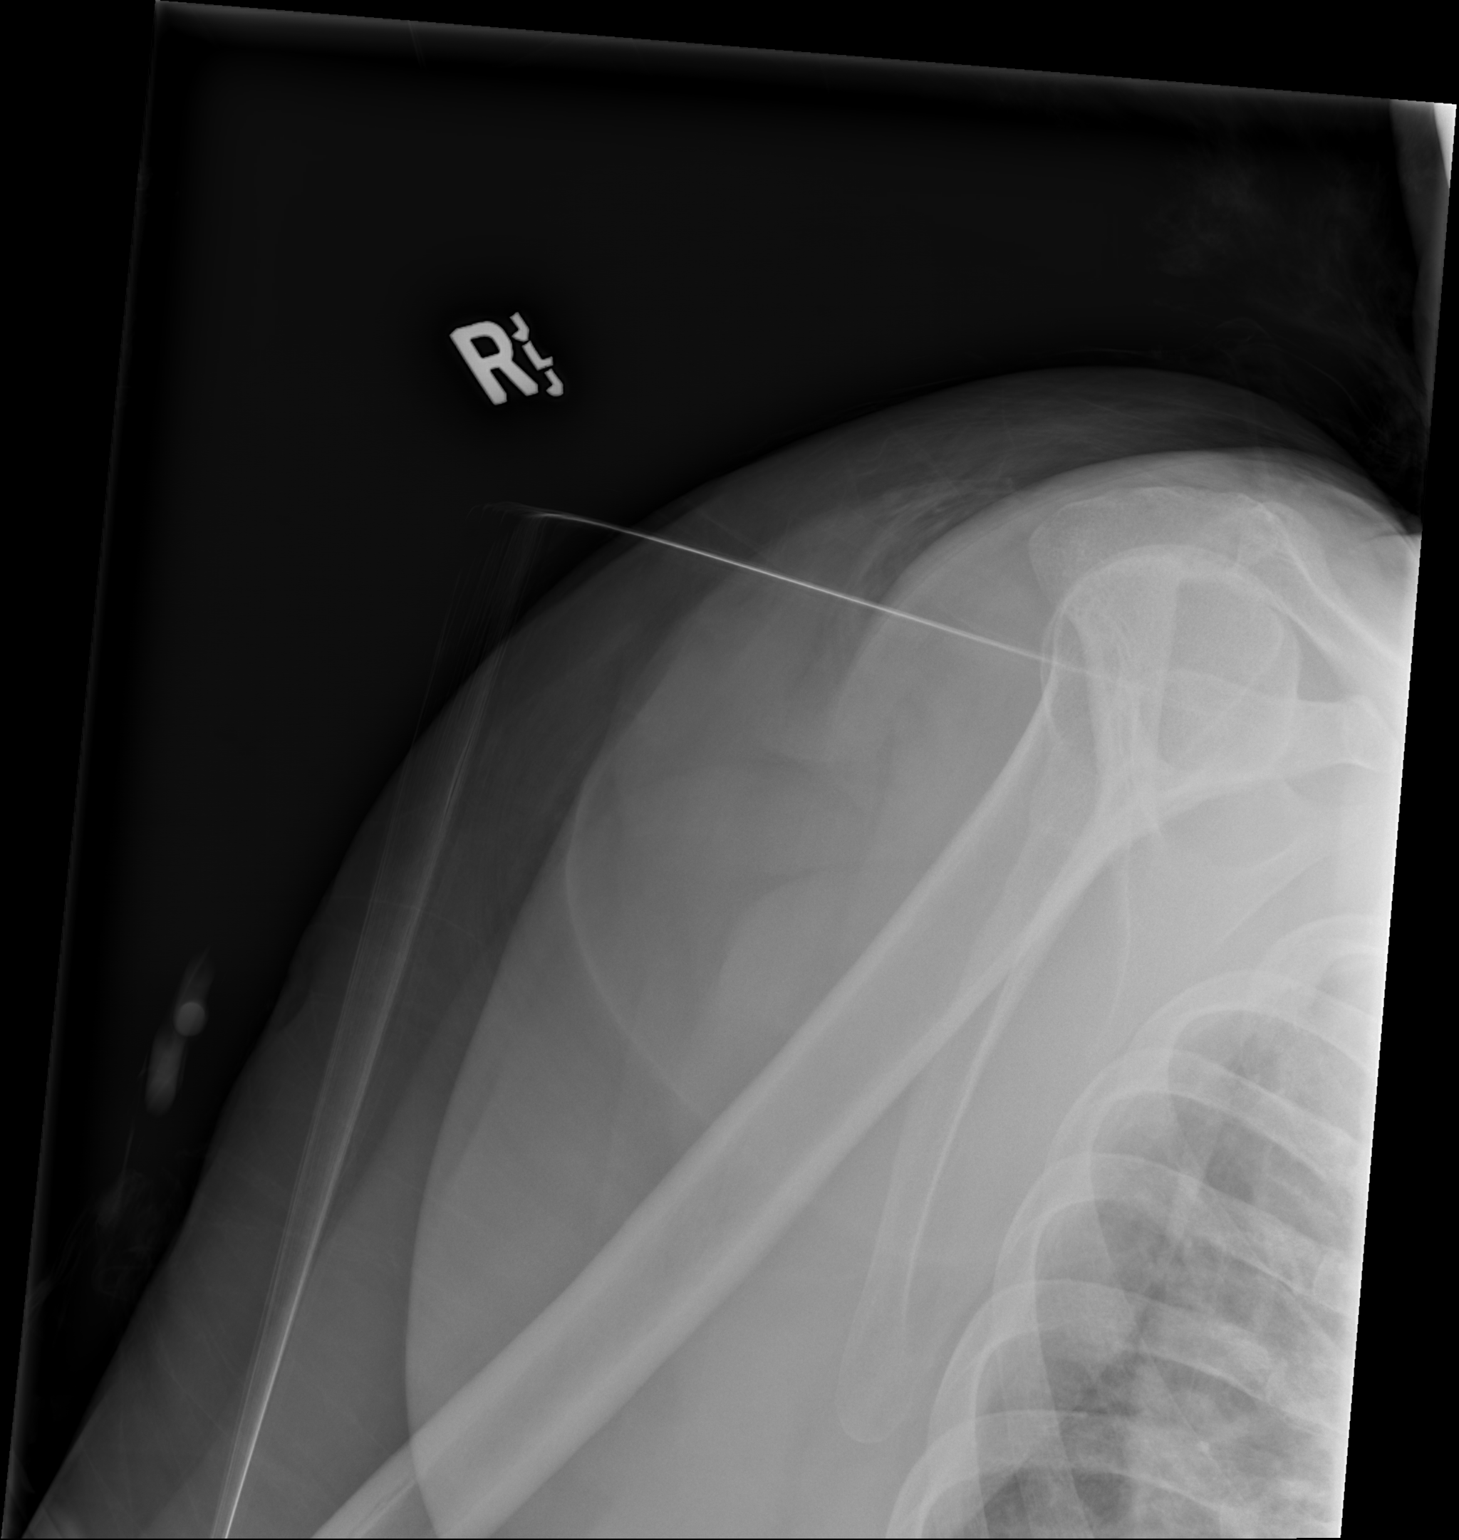

[2 of 2 positions shown; findings below may reference images not displayed]

FINDINGS: There has been successful reduction of the right humeral head
dislocation. No definite Hill-Sachs or osseous Bankart lesion is
seen.

Mild degenerative change is noted at the right acromioclavicular
joint. The visualized portions of the right lung are clear. No
definite soft tissue abnormalities are characterized on radiograph.
IMPRESSION: Successful reduction of right humeral head dislocation. No definite
Hill-Sachs or osseous Bankart lesion seen.

## 2019-10-02 IMAGING — CR DG SHOULDER 2+V*R*
2 series · 2 of 2 positions shown · non-contrast
Comparison: None.

CLINICAL DATA: Status post fall, shoulder pain

EXAM:
RIGHT SHOULDER - 2+ VIEW

[x shoulder ap right (1 of 2)]
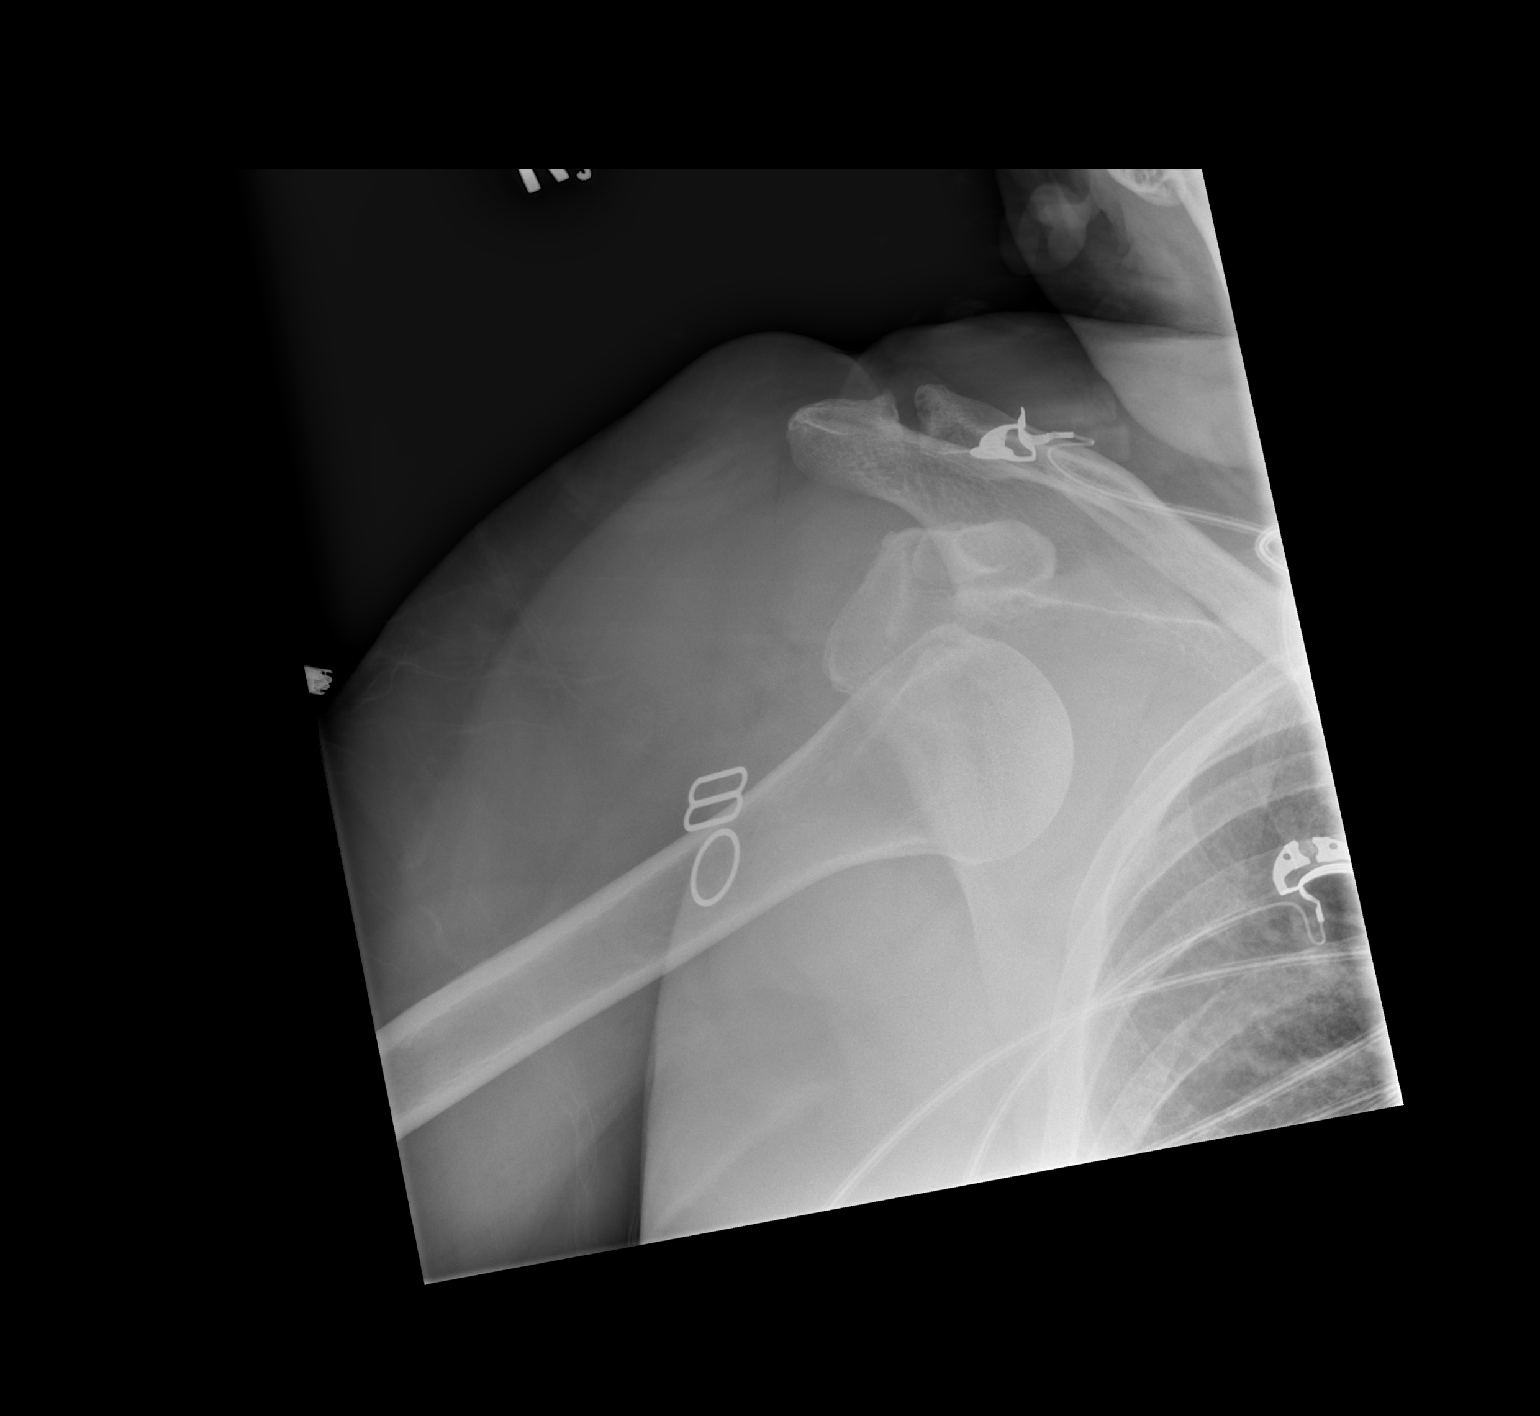

[x shoulder ap right (2 of 2)]
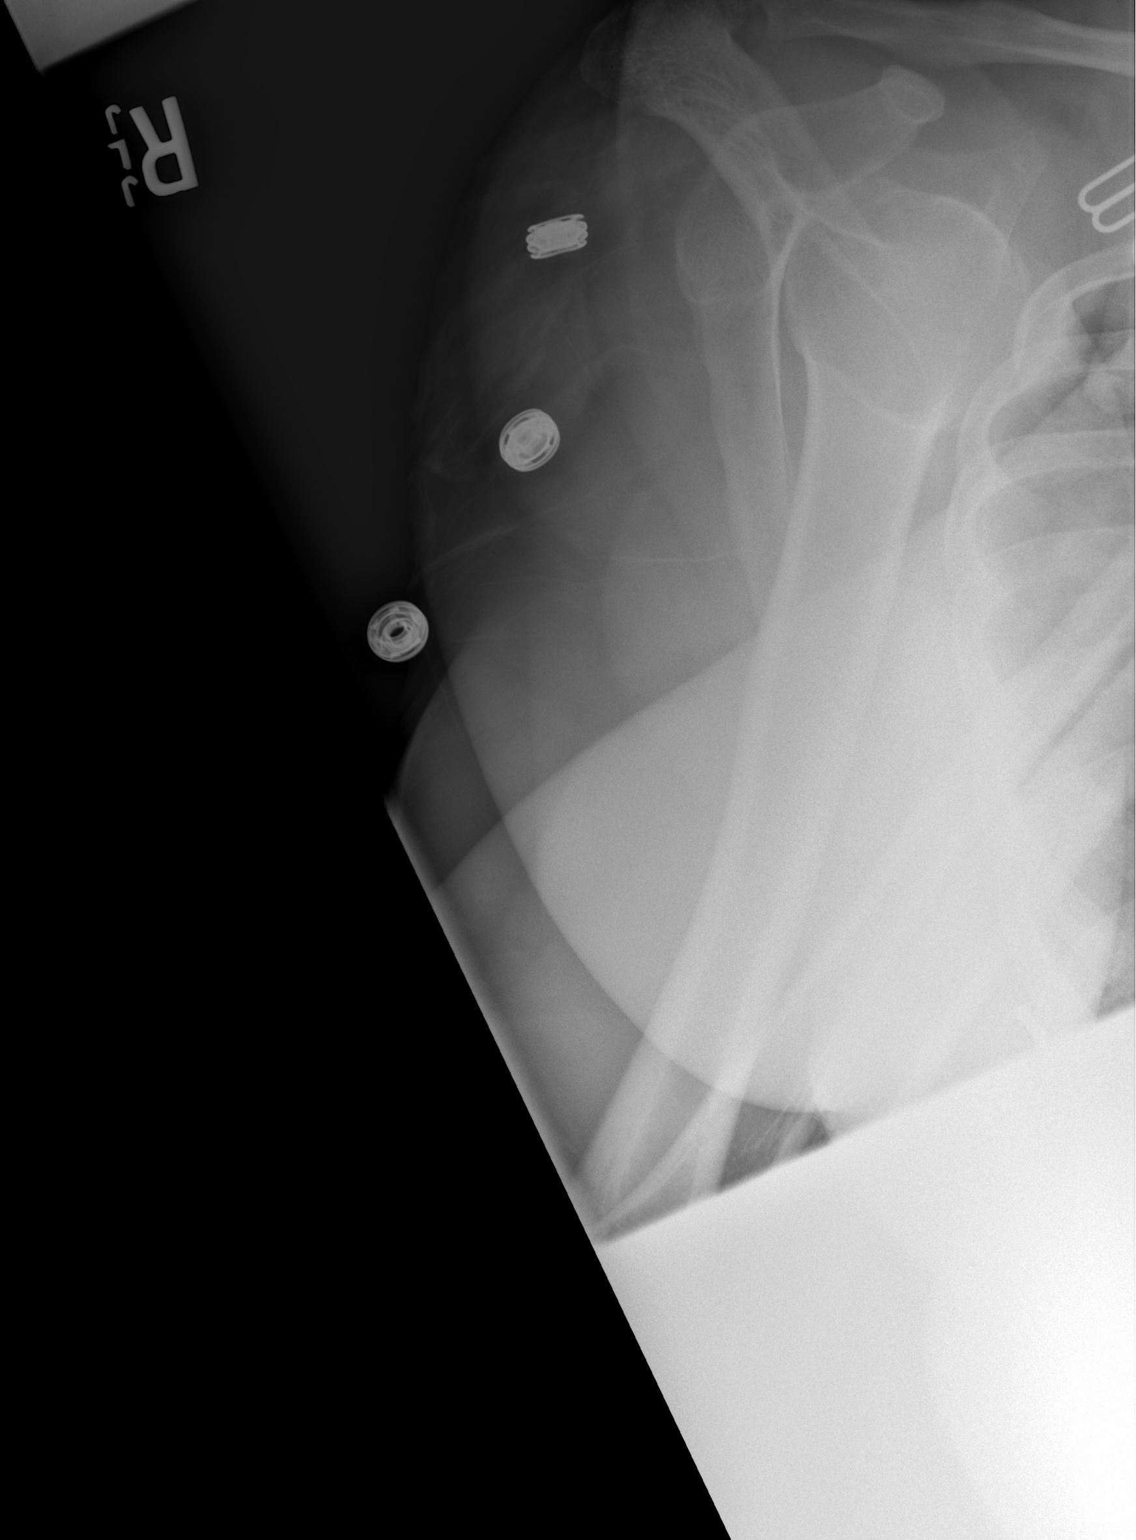

[2 of 2 positions shown; findings below may reference images not displayed]

FINDINGS: Anterior inferior right shoulder dislocation. No acute fracture. No
aggressive osseous lesion. Mild arthropathy of the right
acromioclavicular joint.
IMPRESSION: Anterior inferior right shoulder dislocation.

## 2019-10-11 HISTORY — PX: BELPHAROPTOSIS REPAIR: SHX369

## 2019-12-27 IMAGING — MR MR SHOULDER*R* W/O CM
4 of 5 series · 28 of 40 positions shown · non-contrast
Comparison: Radiographs 08/26/2018

CLINICAL DATA: Pain and limited range of motion. Fell in August 2018

EXAM:
MRI OF THE RIGHT SHOULDER WITHOUT CONTRAST
TECHNIQUE: Multiplanar, multisequence MR imaging of the shoulder was performed.
No intravenous contrast was administered.

[Series 4: PD fat-sat · axial · 4.0mm · 0.27mm/px · z∈[-32,+53]mm · 8 of 20 slices shown (1 of 2)]
[im 1/20]
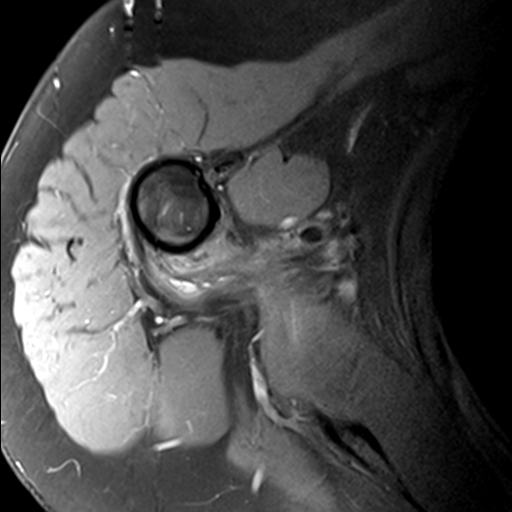
[im 3/20]
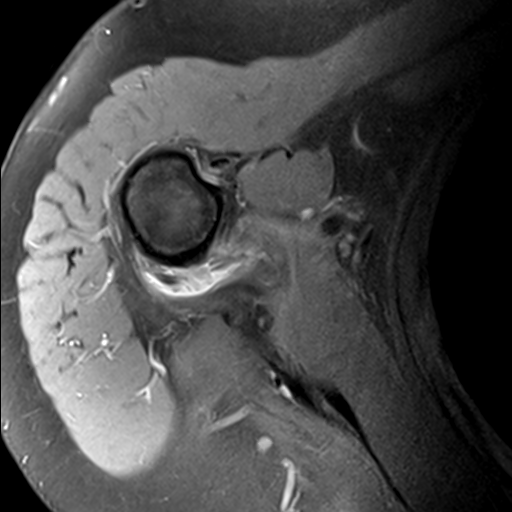
[im 7/20]
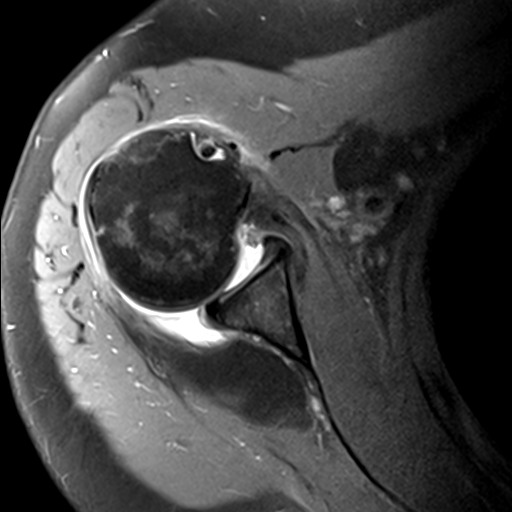
[im 9/20]
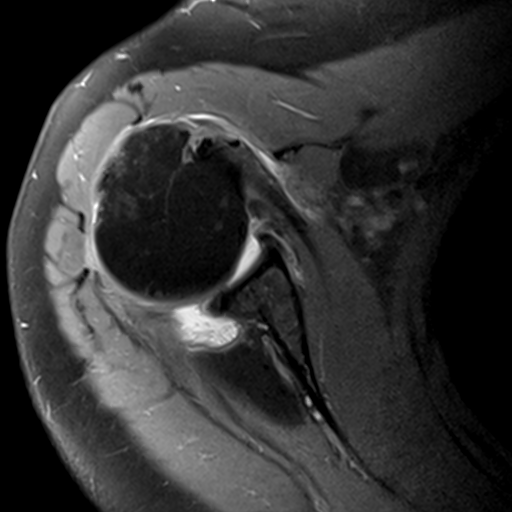
[im 11/20]
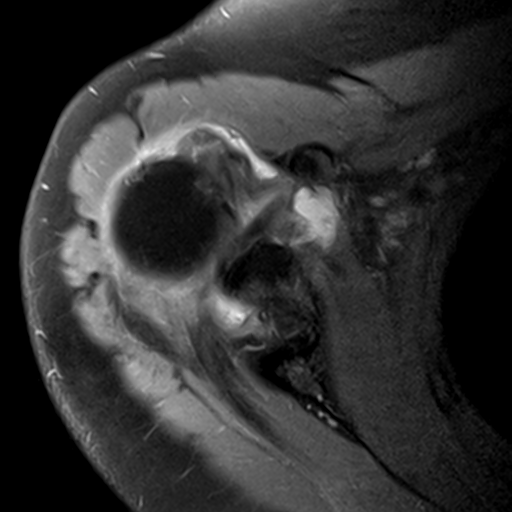
[im 13/20]
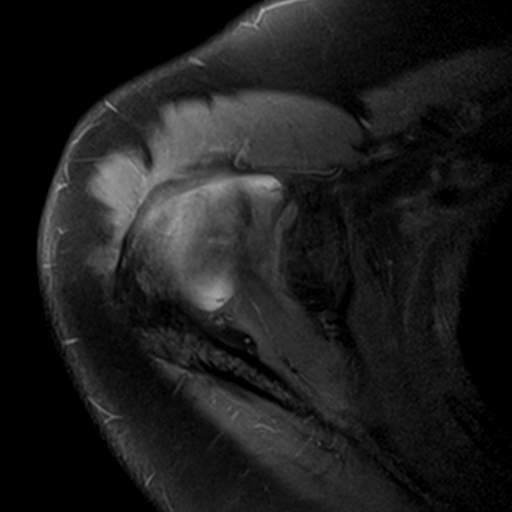
[im 17/20]
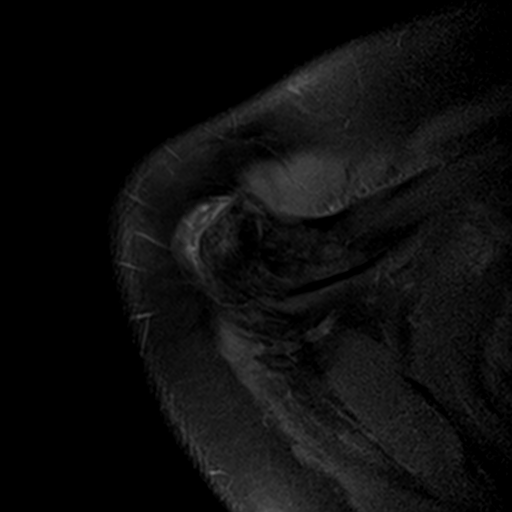
[im 20/20]
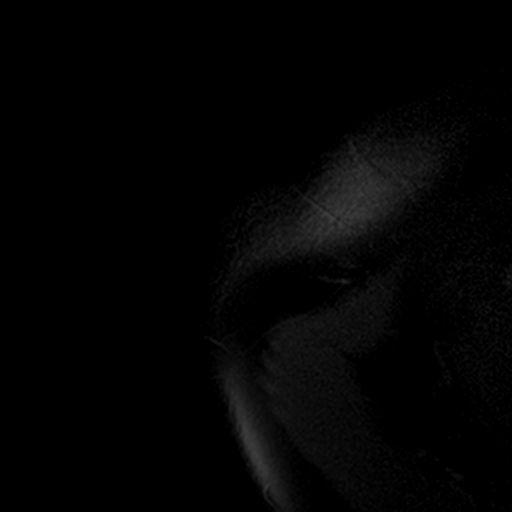

[Series 5: T2 fat-sat · oblique · 4.0mm · 0.55mm/px · 7 of 15 slices shown (1 of 2)]
[im 1/15]
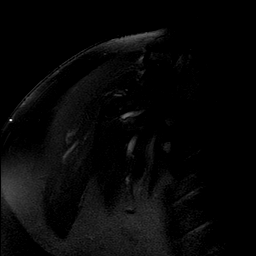
[im 3/15]
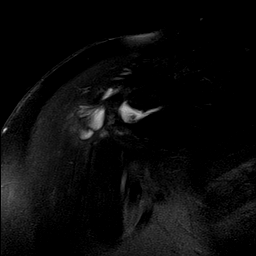
[im 5/15]
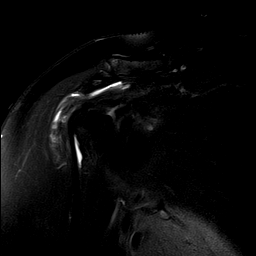
[im 8/15]
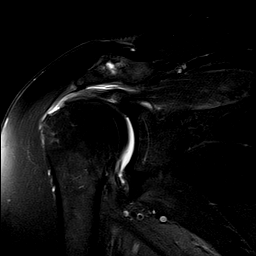
[im 10/15]
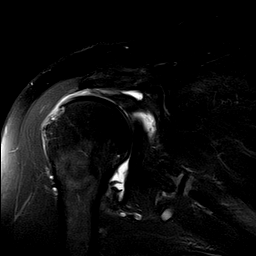
[im 12/15]
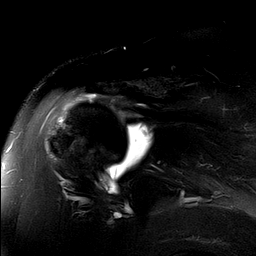
[im 15/15]
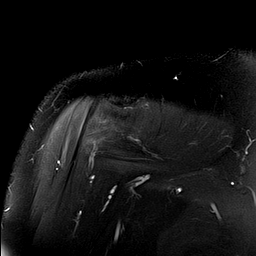

[Series 6: PD fat-sat · oblique · 4.0mm · 0.27mm/px · 7 of 15 slices shown (2 of 2)]
[im 1/15]
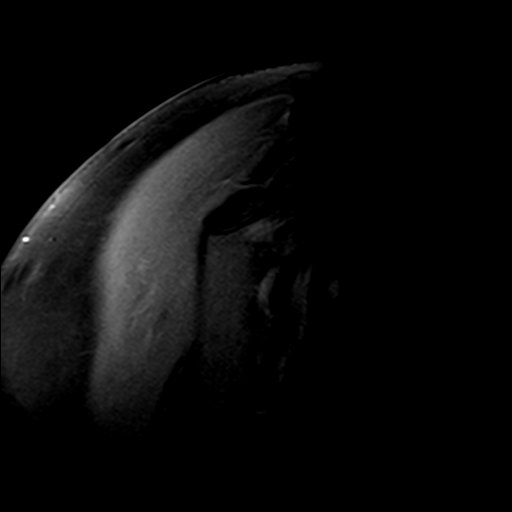
[im 3/15]
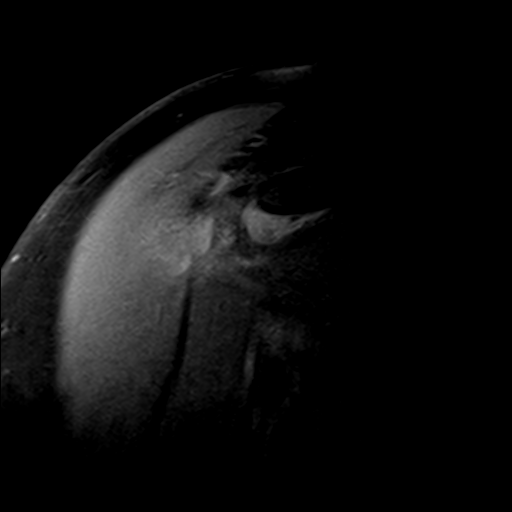
[im 5/15]
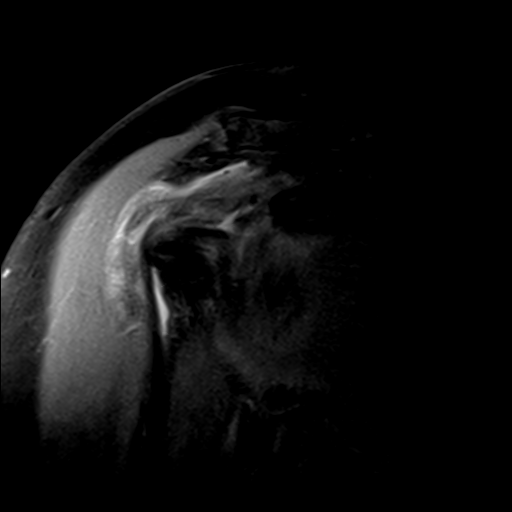
[im 8/15]
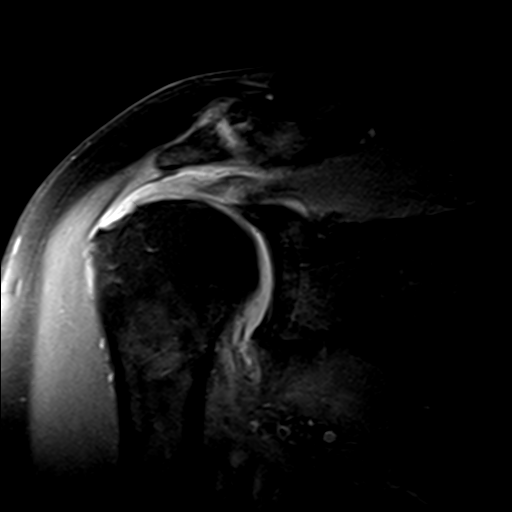
[im 10/15]
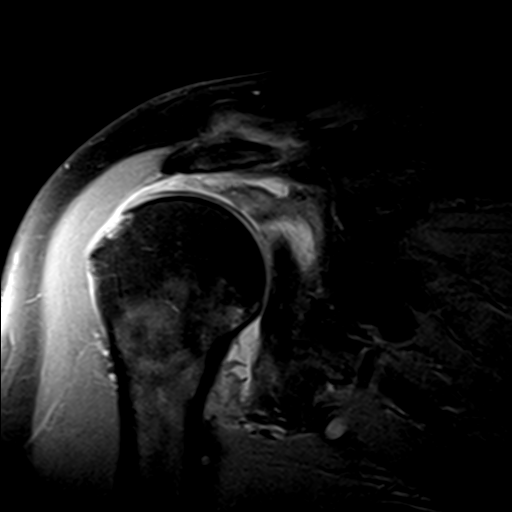
[im 12/15]
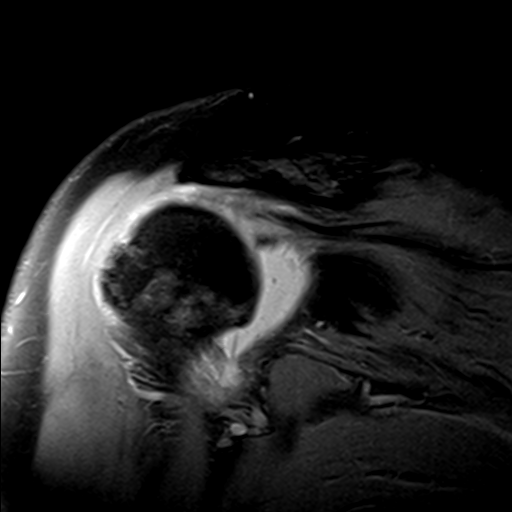
[im 15/15]
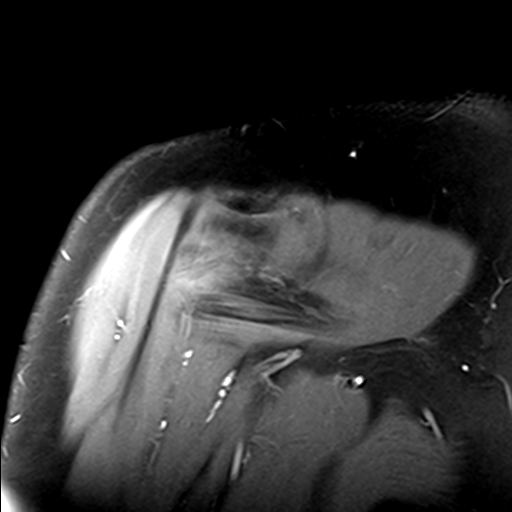

[Series 7: T2 fat-sat · oblique · 4.0mm · 0.55mm/px · 6 of 17 slices shown (2 of 2)]
[im 1/17]
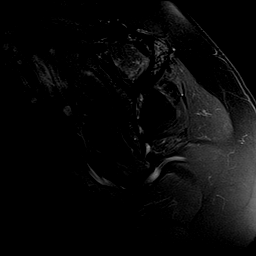
[im 3/17]
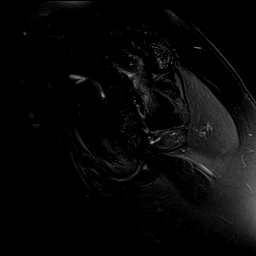
[im 5/17]
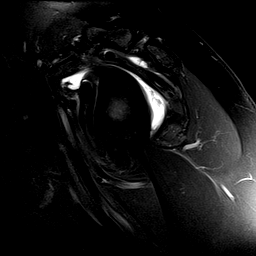
[im 7/17]
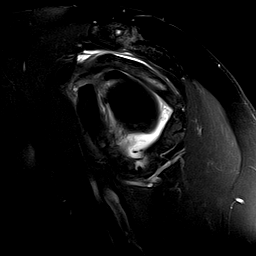
[im 10/17]
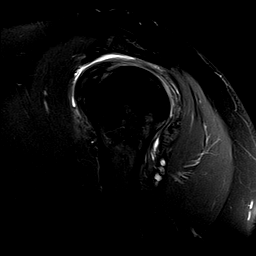
[im 14/17]
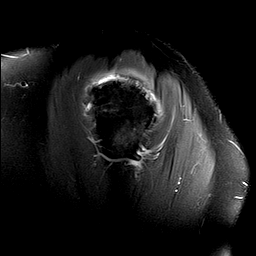

[28 of 40 positions shown; findings below may reference images not displayed]

FINDINGS: Rotator cuff: Large full-thickness retracted rotator cuff tear
involving the supraspinatus and infraspinatus tendons. Both tendons
are retracted approximately 3 cm. The subscapularis tendon is
intact. Moderate tendinopathy with interstitial tears. The teres
minor tendon is intact.

Muscles: Moderate fatty atrophy of the teres minor muscle may be
related to an old nerve injury.

There is a lipoma noted in or adjacent to the infraspinatus muscle.
No atrophy of the supraspinatus muscle.

Biceps long head: Intact. Moderate tendinopathy involving the
intra-articular portion.

Acromioclavicular Joint: Moderate degenerative changes with superior
spurring. Type 1 acromion. Mild lateral downsloping but no
undersurface spurring.

Glenohumeral Joint: Mild degenerative changes. Moderate-sized joint
effusion and mild synovitis.

Labrum:  No obvious labral tears.

Bones:  No acute bony findings.

Other: Moderate expected fluid in the subacromial/subdeltoid bursa.
IMPRESSION: 1. Large full-thickness retracted tears of the supraspinatus and
infraspinatus tendons.
2. Chronic fatty atrophy of the teres minor muscle possibly due to
an old nerve injury.
3. Moderate tendinopathy involving the intra-articular portion of
the long head biceps tendon.
4. Intact glenoid labrum.
5. No significant findings for bony impingement.

## 2020-10-15 ENCOUNTER — Encounter (INDEPENDENT_AMBULATORY_CARE_PROVIDER_SITE_OTHER): Payer: Self-pay | Admitting: Family Medicine

## 2020-10-15 ENCOUNTER — Other Ambulatory Visit: Payer: Self-pay

## 2020-10-15 ENCOUNTER — Ambulatory Visit (INDEPENDENT_AMBULATORY_CARE_PROVIDER_SITE_OTHER): Payer: 59 | Admitting: Family Medicine

## 2020-10-15 VITALS — BP 137/83 | HR 73 | Temp 98.1°F | Ht 66.0 in | Wt 225.0 lb

## 2020-10-15 DIAGNOSIS — R030 Elevated blood-pressure reading, without diagnosis of hypertension: Secondary | ICD-10-CM

## 2020-10-15 DIAGNOSIS — E559 Vitamin D deficiency, unspecified: Secondary | ICD-10-CM | POA: Diagnosis not present

## 2020-10-15 DIAGNOSIS — Z6836 Body mass index (BMI) 36.0-36.9, adult: Secondary | ICD-10-CM

## 2020-10-15 DIAGNOSIS — R0602 Shortness of breath: Secondary | ICD-10-CM | POA: Diagnosis not present

## 2020-10-15 DIAGNOSIS — R5383 Other fatigue: Secondary | ICD-10-CM

## 2020-10-15 DIAGNOSIS — Z1331 Encounter for screening for depression: Secondary | ICD-10-CM

## 2020-10-15 DIAGNOSIS — Z0289 Encounter for other administrative examinations: Secondary | ICD-10-CM

## 2020-10-16 LAB — COMPREHENSIVE METABOLIC PANEL
ALT: 47 IU/L — ABNORMAL HIGH (ref 0–32)
AST: 30 IU/L (ref 0–40)
Albumin/Globulin Ratio: 1.9 (ref 1.2–2.2)
Albumin: 4.5 g/dL (ref 3.8–4.9)
Alkaline Phosphatase: 104 IU/L (ref 44–121)
BUN/Creatinine Ratio: 14 (ref 12–28)
BUN: 11 mg/dL (ref 8–27)
Bilirubin Total: 0.3 mg/dL (ref 0.0–1.2)
CO2: 21 mmol/L (ref 20–29)
Calcium: 9.1 mg/dL (ref 8.7–10.3)
Chloride: 102 mmol/L (ref 96–106)
Creatinine, Ser: 0.81 mg/dL (ref 0.57–1.00)
GFR calc Af Amer: 91 mL/min/{1.73_m2} (ref >59)
GFR calc non Af Amer: 79 mL/min/{1.73_m2} (ref >59)
Globulin, Total: 2.4 g/dL (ref 1.5–4.5)
Glucose: 95 mg/dL (ref 65–99)
Potassium: 4.6 mmol/L (ref 3.5–5.2)
Sodium: 140 mmol/L (ref 134–144)
Total Protein: 6.9 g/dL (ref 6.0–8.5)

## 2020-10-16 LAB — CBC WITH DIFFERENTIAL/PLATELET
Basophils Absolute: 0.1 10*3/uL (ref 0.0–0.2)
Basos: 1 %
EOS (ABSOLUTE): 0.3 10*3/uL (ref 0.0–0.4)
Eos: 3 %
Hematocrit: 44.8 % (ref 34.0–46.6)
Hemoglobin: 15.4 g/dL (ref 11.1–15.9)
Immature Grans (Abs): 0 10*3/uL (ref 0.0–0.1)
Immature Granulocytes: 0 %
Lymphocytes Absolute: 2.8 10*3/uL (ref 0.7–3.1)
Lymphs: 35 %
MCH: 32.7 pg (ref 26.6–33.0)
MCHC: 34.4 g/dL (ref 31.5–35.7)
MCV: 95 fL (ref 79–97)
Monocytes Absolute: 0.5 10*3/uL (ref 0.1–0.9)
Monocytes: 6 %
Neutrophils Absolute: 4.4 10*3/uL (ref 1.4–7.0)
Neutrophils: 55 %
Platelets: 267 10*3/uL (ref 150–450)
RBC: 4.71 x10E6/uL (ref 3.77–5.28)
RDW: 13 % (ref 11.7–15.4)
WBC: 8 10*3/uL (ref 3.4–10.8)

## 2020-10-16 LAB — LIPID PANEL WITH LDL/HDL RATIO
Cholesterol, Total: 185 mg/dL (ref 100–199)
HDL: 54 mg/dL (ref >39)
LDL Chol Calc (NIH): 115 mg/dL — ABNORMAL HIGH (ref 0–99)
LDL/HDL Ratio: 2.1 ratio (ref 0.0–3.2)
Triglycerides: 85 mg/dL (ref 0–149)
VLDL Cholesterol Cal: 16 mg/dL (ref 5–40)

## 2020-10-16 LAB — FOLATE: Folate: 14.7 ng/mL (ref >3.0)

## 2020-10-16 LAB — TSH: TSH: 1.51 u[IU]/mL (ref 0.450–4.500)

## 2020-10-16 LAB — T4: T4, Total: 6 ug/dL (ref 4.5–12.0)

## 2020-10-16 LAB — VITAMIN D 25 HYDROXY (VIT D DEFICIENCY, FRACTURES): Vit D, 25-Hydroxy: 84.2 ng/mL (ref 30.0–100.0)

## 2020-10-16 LAB — VITAMIN B12: Vitamin B-12: 591 pg/mL (ref 232–1245)

## 2020-10-16 LAB — INSULIN, RANDOM: INSULIN: 29.3 u[IU]/mL — ABNORMAL HIGH (ref 2.6–24.9)

## 2020-10-16 LAB — HEMOGLOBIN A1C
Est. average glucose Bld gHb Est-mCnc: 111 mg/dL
Hgb A1c MFr Bld: 5.5 % (ref 4.8–5.6)

## 2020-10-16 LAB — T3: T3, Total: 113 ng/dL (ref 71–180)

## 2020-10-22 NOTE — Progress Notes (Signed)
Chief Complaint:   OBESITY Heather Morales (MR# [id number]) is a 60 y.o. female who presents for evaluation and treatment of obesity and related comorbidities. Current BMI is Body mass index is 36.32 kg/m. Heather Morales has been struggling with her weight for many years and has been unsuccessful in either losing weight, maintaining weight loss, or reaching her healthy weight goal.  Heather Morales started with this clinic approximately 2 years ago right before the start of the pandemic. She has been working a lot and unable to concentrate on weight loss, but she is now ready to get back on track.  Heather Morales is currently in the action stage of change and ready to dedicate time achieving and maintaining a healthier weight. Heather Morales is interested in becoming our patient and working on intensive lifestyle modifications including (but not limited to) diet and exercise for weight loss.  Heather Morales's habits were reviewed today and are as follows: her desired weight loss is 35 lbs, she started gaining weight 15 years ago, her heaviest weight ever was 230 pounds, she is a picky eater and doesn't like to eat healthier foods, she has significant food cravings issues, she snacks frequently in the evenings, she is frequently drinking liquids with calories, she has problems with excessive hunger, she frequently eats larger portions than normal and she struggles with emotional eating.  Depression Screen Heather Morales's Food and Mood (modified PHQ-9) score was 8.  Depression screen PHQ 2/9 10/15/2020  Decreased Interest 1  Down, Depressed, Hopeless 1  PHQ - 2 Score 2  Altered sleeping 1  Tired, decreased energy 1  Change in appetite 2  Feeling bad or failure about yourself  2  Trouble concentrating 0  Moving slowly or fidgety/restless 0  Suicidal thoughts 0  PHQ-9 Score 8   Subjective:   1. Other fatigue Clarke admits to daytime somnolence and admits to waking up still tired. Heather Morales has a history of symptoms of daytime  fatigue. Heather Morales generally gets 6 or 7 hours of sleep per night, and states that she has difficulty falling asleep. Snoring is present. Apneic episodes are not present. Epworth Sleepiness Score is 3.  2. Shortness of breath on exertion Kishia notes increasing shortness of breath with exercising and seems to be worsening over time with weight gain. She notes getting out of breath sooner with activity than she used to. This has not gotten worse recently. Heather Morales denies shortness of breath at rest or orthopnea.  3. Pre-hypertension Heather Morales has borderline elevated blood pressure readings, and she is not on medications.  4. Vitamin D deficiency Heather Morales is on Vit D OTC 5,000 IU daily. She is due to have labs checked.  Assessment/Plan:   1. Other fatigue Heather Morales does feel that her weight is causing her energy to be lower than it should be. Fatigue may be related to obesity, depression or many other causes. Labs will be ordered, and in the meanwhile, Heather Morales will focus on self care including making healthy food choices, increasing physical activity and focusing on stress reduction.  - Hemoglobin A1c - Folate - Vitamin B12 - Insulin, random - T3 - T4 - TSH  2. Shortness of breath on exertion Heather Morales does feel that she gets out of breath more easily that she used to when she exercises. Heather Morales shortness of breath appears to be obesity related and exercise induced. She has agreed to work on weight loss and gradually increase exercise to treat her exercise induced shortness of breath. Will continue to monitor closely.  -  EKG 12-Lead - CBC with Differential/Platelet - Lipid Panel With LDL/HDL Ratio  3. Pre-hypertension Heather Morales will start her journaling plan, and will continue working on healthy weight loss and exercise to improve blood pressure control. We will watch for signs of hypotension as she continues her lifestyle modifications. We will check labs today.  - Comprehensive metabolic panel  4.  Vitamin D deficiency Low Vitamin D level contributes to fatigue and are associated with obesity, breast, and colon cancer. We will check labs today. Heather Morales will follow-up for routine testing of Vitamin D, at least 2-3 times per year to avoid over-replacement.  - VITAMIN D 25 Hydroxy (Vit-D Deficiency, Fractures)  5. Screening for depression Heather Morales had a positive depression screening. Depression is commonly associated with obesity and often results in emotional eating behaviors. We will monitor this closely and work on CBT to help improve the non-hunger eating patterns. Referral to Psychology may be required if no improvement is seen as she continues in our clinic.  6. Class 2 severe obesity with serious comorbidity and body mass index (BMI) of 36.0 to 36.9 in adult, unspecified obesity type (HCC) Heather Morales is currently in the action stage of change and her goal is to continue with weight loss efforts. I recommend Heather Morales begin the structured treatment plan as follows:  She has agreed to keeping a food journal and adhering to recommended goals of 1400-1600 calories and 80+ grams of protein daily.  Exercise goals: No exercise has been prescribed for now, while we concentrate on nutritional changes.  Behavioral modification strategies: increasing lean protein intake and meal planning and cooking strategies.  She was informed of the importance of frequent follow-up visits to maximize her success with intensive lifestyle modifications for her multiple health conditions. She was informed we would discuss her lab results at her next visit unless there is a critical issue that needs to be addressed sooner. Heather Morales agreed to keep her next visit at the agreed upon time to discuss these results.  Objective:   Blood pressure 137/83, pulse 73, temperature 98.1 F (36.7 C), height 5\' 6"  (1.676 m), weight 225 lb (102.1 kg), SpO2 97 %. Body mass index is 36.32 kg/m.  EKG: Normal sinus rhythm, rate 73  BPM.  Indirect Calorimeter completed today shows a VO2 of 275 and a REE of 1912.  Her calculated basal metabolic rate is 9242 thus her basal metabolic rate is better than expected.  General: Cooperative, alert, well developed, in no acute distress. HEENT: Conjunctivae and lids unremarkable. Cardiovascular: Regular rhythm.  Lungs: Normal work of breathing. Neurologic: No focal deficits.   Lab Results  Component Value Date   CREATININE 0.81 10/15/2020   BUN 11 10/15/2020   NA 140 10/15/2020   K 4.6 10/15/2020   CL 102 10/15/2020   CO2 21 10/15/2020   Lab Results  Component Value Date   ALT 47 (H) 10/15/2020   AST 30 10/15/2020   ALKPHOS 104 10/15/2020   BILITOT 0.3 10/15/2020   Lab Results  Component Value Date   HGBA1C 5.5 10/15/2020   HGBA1C 5.2 10/31/2018   Lab Results  Component Value Date   INSULIN 29.3 (H) 10/15/2020   INSULIN 30.9 (H) 10/31/2018   Lab Results  Component Value Date   TSH 1.510 10/15/2020   Lab Results  Component Value Date   CHOL 185 10/15/2020   HDL 54 10/15/2020   LDLCALC 115 (H) 10/15/2020   TRIG 85 10/15/2020   Lab Results  Component Value Date  WBC 8.0 10/15/2020   HGB 15.4 10/15/2020   HCT 44.8 10/15/2020   MCV 95 10/15/2020   PLT 267 10/15/2020   No results found for: IRON, TIBC, FERRITIN  Attestation Statements:   Reviewed by clinician on day of visit: allergies, medications, problem list, medical history, surgical history, family history, social history, and previous encounter notes.  Time spent on visit including pre-visit chart review and post-visit charting and care was 45 minutes.    I, Trixie Dredge, am acting as transcriptionist for Dennard Nip, MD.  I have reviewed the above documentation for accuracy and completeness, and I agree with the above. - Dennard Nip, MD

## 2020-10-29 ENCOUNTER — Encounter (INDEPENDENT_AMBULATORY_CARE_PROVIDER_SITE_OTHER): Payer: Self-pay | Admitting: Family Medicine

## 2020-10-29 ENCOUNTER — Ambulatory Visit (INDEPENDENT_AMBULATORY_CARE_PROVIDER_SITE_OTHER): Payer: 59 | Admitting: Family Medicine

## 2020-10-29 ENCOUNTER — Other Ambulatory Visit: Payer: Self-pay

## 2020-10-29 VITALS — BP 141/79 | HR 73 | Temp 97.8°F | Ht 66.0 in | Wt 220.0 lb

## 2020-10-29 DIAGNOSIS — R7989 Other specified abnormal findings of blood chemistry: Secondary | ICD-10-CM

## 2020-10-29 DIAGNOSIS — E559 Vitamin D deficiency, unspecified: Secondary | ICD-10-CM | POA: Diagnosis not present

## 2020-10-29 DIAGNOSIS — Z9189 Other specified personal risk factors, not elsewhere classified: Secondary | ICD-10-CM

## 2020-10-29 DIAGNOSIS — E8881 Metabolic syndrome: Secondary | ICD-10-CM | POA: Diagnosis not present

## 2020-10-29 DIAGNOSIS — F3289 Other specified depressive episodes: Secondary | ICD-10-CM

## 2020-10-29 DIAGNOSIS — Z6835 Body mass index (BMI) 35.0-35.9, adult: Secondary | ICD-10-CM

## 2020-10-29 MED ORDER — TOPIRAMATE 50 MG PO TABS: 50.0000 mg | ORAL_TABLET | Freq: Every day | ORAL | 0 refills | Status: DC

## 2020-11-02 NOTE — Progress Notes (Signed)
Chief Complaint:   OBESITY Heather Morales is here to discuss her progress with her obesity treatment plan along with follow-up of her obesity related diagnoses. Heather Morales is on keeping a food journal and adhering to recommended goals of 1400-1600 calories and 80+ grams of protein daily and states she is following her eating plan approximately 90% of the time. Heather Morales states she is doing 0 minutes 0 times per week.  Today's visit was #: 2 Starting weight: 225 lbs Starting date: 10/15/2020 Today's weight: 220 lbs Today's date: 10/29/2020 Total lbs lost to date: 5 Total lbs lost since last in-office visit: 5  Interim History: Heather Morales has done well with weight loss on her journaling plan. She had been sick with COVID, but still was able to journal and meet her goals most of the time. She is interested in going over more high protein meals.  Subjective:   1. Other depression with emotional eating Heather Morales is on Wellbutrin and Lexapro, and she appears to be doing well but notes struggling with emotional eating especially in the evenings.  2. Vitamin D deficiency Heather Morales is on Vit D 5,000 IU daily, and her Vit D level is at gaol. She is at high risk of over-replacement. Due to weight loss and high dose of Vit D. I discussed labs with the patient today.  3. Insulin resistance Heather Morales's insulin resistance is stable and is not worse, but not better. She notes polyphagia. I discussed labs with the patient today.  4. Elevated LFTs Heather Morales's ALT is elevated but AST within normal limits, consistent with non alcoholic fatty liver disease. She denies abdominal pain or jaundice. I discussed labs with the patient today.  5. At risk for impaired function of liver Heather Morales is at risk for impaired function of liver due to elevated ALT.  Assessment/Plan:   1. Other depression with emotional eating Behavior modification techniques were discussed today to help Heather Morales deal with her emotional/non-hunger eating behaviors.  Heather Morales agreed to start Topamax 50 mg 1 tablet PO with dinner, with no refills and will follow up in 2 weeks. Orders and follow up as documented in patient record.   - topiramate (TOPAMAX) 50 MG tablet; Take 1 tablet (50 mg total) by mouth at bedtime.  Dispense: 30 tablet; Refill: 0  2. Vitamin D deficiency Low Vitamin D level contributes to fatigue and are associated with obesity, breast, and colon cancer. Heather Morales agreed to decrease Vitamin D to 5,000 IU every other day, and we will recheck labs in 3 months. She will follow-up for routine testing of Vitamin D, at least 2-3 times per year to avoid over-replacement.  3. Insulin resistance Heather Morales will continue to work on weight loss, diet, exercise, and decreasing simple carbohydrates to help decrease the risk of diabetes. We may need to consider medications if no improvement. Heather Morales agreed to follow-up with [name] as directed to closely monitor her progress.  4. Elevated LFTs We discussed the likely diagnosis of non-alcoholic fatty liver disease today and how this condition is obesity related. Heather Morales was educated the importance of weight loss. Heather Morales agreed to continue with her weight loss efforts with healthier diet and exercise as an essential part of her treatment plan. We will recheck labs in 3 months, we may need to evaluate further if no improvement.  5. At risk for impaired function of liver Heather Morales was given approximately 30 minutes of counseling today regarding prevention of impaired liver function. Heather Morales was educated about her risk of developing NASH or even  liver failure and advised that the only proven treatment for NAFLD was weight loss of at least 5-10% of body weight.   6. Class 2 severe obesity with serious comorbidity and body mass index (BMI) of 35.0 to 35.9 in adult, unspecified obesity type (HCC) Heather Morales is currently in the action stage of change. As such, her goal is to continue with weight loss efforts. She has agreed to keeping a food  journal and adhering to recommended goals of 1400-1600 calories and 80+ grams of protein daily.   Soup recipes were given and discussed today.  Behavioral modification strategies: meal planning and cooking strategies.  Heather Morales has agreed to follow-up with our clinic in 2 weeks. She was informed of the importance of frequent follow-up visits to maximize her success with intensive lifestyle modifications for her multiple health conditions.   Objective:   Blood pressure (!) 141/79, pulse 73, temperature 97.8 F (36.6 C), height 5\' 6"  (1.676 m), weight 220 lb (99.8 kg), SpO2 97 %. Body mass index is 35.51 kg/m.  General: Cooperative, alert, well developed, in no acute distress. HEENT: Conjunctivae and lids unremarkable. Cardiovascular: Regular rhythm.  Lungs: Normal work of breathing. Neurologic: No focal deficits.   Lab Results  Component Value Date   CREATININE 0.81 10/15/2020   BUN 11 10/15/2020   NA 140 10/15/2020   K 4.6 10/15/2020   CL 102 10/15/2020   CO2 21 10/15/2020   Lab Results  Component Value Date   ALT 47 (H) 10/15/2020   AST 30 10/15/2020   ALKPHOS 104 10/15/2020   BILITOT 0.3 10/15/2020   Lab Results  Component Value Date   HGBA1C 5.5 10/15/2020   HGBA1C 5.2 10/31/2018   Lab Results  Component Value Date   INSULIN 29.3 (H) 10/15/2020   INSULIN 30.9 (H) 10/31/2018   Lab Results  Component Value Date   TSH 1.510 10/15/2020   Lab Results  Component Value Date   CHOL 185 10/15/2020   HDL 54 10/15/2020   LDLCALC 115 (H) 10/15/2020   TRIG 85 10/15/2020   Lab Results  Component Value Date   WBC 8.0 10/15/2020   HGB 15.4 10/15/2020   HCT 44.8 10/15/2020   MCV 95 10/15/2020   PLT 267 10/15/2020   No results found for: IRON, TIBC, FERRITIN  Attestation Statements:   Reviewed by clinician on day of visit: allergies, medications, problem list, medical history, surgical history, family history, social history, and previous encounter notes.   I,  Trixie Dredge, am acting as transcriptionist for Dennard Nip, MD.  I have reviewed the above documentation for accuracy and completeness, and I agree with the above. -  Dennard Nip, MD

## 2020-11-09 ENCOUNTER — Ambulatory Visit (INDEPENDENT_AMBULATORY_CARE_PROVIDER_SITE_OTHER): Payer: 59 | Admitting: Physician Assistant

## 2020-11-09 ENCOUNTER — Encounter (INDEPENDENT_AMBULATORY_CARE_PROVIDER_SITE_OTHER): Payer: Self-pay | Admitting: Family Medicine

## 2020-11-09 ENCOUNTER — Encounter (INDEPENDENT_AMBULATORY_CARE_PROVIDER_SITE_OTHER): Payer: Self-pay

## 2020-11-09 ENCOUNTER — Other Ambulatory Visit: Payer: Self-pay

## 2020-11-09 ENCOUNTER — Ambulatory Visit (INDEPENDENT_AMBULATORY_CARE_PROVIDER_SITE_OTHER): Payer: 59 | Admitting: Family Medicine

## 2020-11-09 VITALS — BP 130/77 | HR 85 | Temp 97.8°F | Ht 66.0 in | Wt 220.0 lb

## 2020-11-09 DIAGNOSIS — Z6835 Body mass index (BMI) 35.0-35.9, adult: Secondary | ICD-10-CM

## 2020-11-09 DIAGNOSIS — F32A Depression, unspecified: Secondary | ICD-10-CM | POA: Insufficient documentation

## 2020-11-09 DIAGNOSIS — Z9189 Other specified personal risk factors, not elsewhere classified: Secondary | ICD-10-CM

## 2020-11-09 DIAGNOSIS — F3289 Other specified depressive episodes: Secondary | ICD-10-CM | POA: Diagnosis not present

## 2020-11-10 NOTE — Progress Notes (Signed)
Chief Complaint:   OBESITY Heather Morales is here to discuss her progress with her obesity treatment plan along with follow-up of her obesity related diagnoses. Heather Morales is on keeping a food journal and adhering to recommended goals of 1400-1600 calories and 80 g protein and states she is following her eating plan approximately 80% of the time. Heather Morales states she is doing 0 minutes 0 times per week.  Today's visit was #: 3 Starting weight: 225 lbs Starting date: 10/15/2020 Today's weight: 220 lbs Today's date: 11/09/2020 Total lbs lost to date: 5 lbs Total lbs lost since last in-office visit: 0 Total weight loss percentage to date: -2.22%  Interim History: Pt reports she had 1 bad day the past 2 weeks. She reports stress in life right now. Meal plan works well for pt. She is hitting protein and calorie goals daily, except that 1 day.   Assessment/Plan:   1. Other depression with emotional eating Heather Morales is a pt of Dr. Migdalia Dk. Dr. Leafy Ro prescribed Topamax at last OV on 10/29/2020. Pt never picked it up, as she says she has not had time.  Plan: Start Topamax that Dr. Leafy Ro work for next OV.Heather Morales Emotional eating strategies discussed with pt.   2. At increased risk for dehydration Heather Morales is at higher than average risk of dehydration. Heather Morales was given more than 9 minutes of proper hydration counseling today. We discussed the signs and symptoms of dehydration some of which may include muscle cramping, constipation or even orthostatic symptoms.   Counseling on the prevention of dehydration was also provided today. Heather Morales is at risk for dehydration due to weight loss, lifestyle and behavorial habits and possibly due to taking certain medication(s). She was encouraged to adequately hydrate and monitor fluid status to avoid dehydration as well as weight loss plateaus. Unless pre-existing renal or cardiopulmonary conditions exist which pt was told to limit their fluid intake, I recommended roughly one half  of their weight in pounds to be the approximate ounces of non-caloric, non-caffeinated beverages they should drink per day; including more if they are engaging in exercise  3. Class 2 severe obesity with serious comorbidity and body mass index (BMI) of 35.0 to 35.9 in adult, unspecified obesity type (HCC) Heather Morales is currently in the action stage of change. As such, her goal is to continue with weight loss efforts. She has agreed to keeping a food journal and adhering to recommended goals of 1400-1600 calories and 80 g protein.   Exercise goals: As is  Behavioral modification strategies: We discussed self-care activities and start 5-10 minutes of meditation daily. meal planning and cooking strategies, keeping healthy foods in the home, avoiding temptations and planning for success.  Heather Morales has agreed to follow-up with our clinic in 2 weeks with Dr. Leafy Ro. She was informed of the importance of frequent follow-up visits to maximize her success with intensive lifestyle modifications for her multiple health conditions.   Objective:   Blood pressure 130/77, pulse 85, temperature 97.8 F (36.6 C), height 5\' 6"  (1.676 m), weight 220 lb (99.8 kg), SpO2 97 %. Body mass index is 35.51 kg/m.  General: Cooperative, alert, well developed, in no acute distress. HEENT: Conjunctivae and lids unremarkable. Cardiovascular: Regular rhythm.  Lungs: Normal work of breathing. Neurologic: No focal deficits.   Lab Results  Component Value Date   CREATININE 0.81 10/15/2020   BUN 11 10/15/2020   NA 140 10/15/2020   K 4.6 10/15/2020   CL 102 10/15/2020   CO2 21 10/15/2020  Lab Results  Component Value Date   ALT 47 (H) 10/15/2020   AST 30 10/15/2020   ALKPHOS 104 10/15/2020   BILITOT 0.3 10/15/2020   Lab Results  Component Value Date   HGBA1C 5.5 10/15/2020   HGBA1C 5.2 10/31/2018   Lab Results  Component Value Date   INSULIN 29.3 (H) 10/15/2020   INSULIN 30.9 (H) 10/31/2018   Lab Results   Component Value Date   TSH 1.510 10/15/2020   Lab Results  Component Value Date   CHOL 185 10/15/2020   HDL 54 10/15/2020   LDLCALC 115 (H) 10/15/2020   TRIG 85 10/15/2020   Lab Results  Component Value Date   WBC 8.0 10/15/2020   HGB 15.4 10/15/2020   HCT 44.8 10/15/2020   MCV 95 10/15/2020   PLT 267 10/15/2020   No results found for: IRON, TIBC, FERRITIN  Attestation Statements:   Reviewed by clinician on day of visit: allergies, medications, problem list, medical history, surgical history, family history, social history, and previous encounter notes.  Coral Ceo, am acting as Location manager for [employer], DO.  I have reviewed the above documentation for accuracy and completeness, and I agree with the above. Marjory Sneddon, D.O.  The [place] was signed into law in 2016 which includes the topic of electronic health records.  This provides immediate access to information in MyChart.  This includes consultation notes, operative notes, office notes, lab results and pathology reports.  If you have any questions about what you read please let us know at your next visit so we can discuss your concerns and take corrective action if need be.  We are right here with you.

## 2020-11-11 ENCOUNTER — Ambulatory Visit (INDEPENDENT_AMBULATORY_CARE_PROVIDER_SITE_OTHER): Payer: 59 | Admitting: Physician Assistant

## 2020-11-30 ENCOUNTER — Other Ambulatory Visit: Payer: Self-pay

## 2020-11-30 ENCOUNTER — Encounter (INDEPENDENT_AMBULATORY_CARE_PROVIDER_SITE_OTHER): Payer: Self-pay | Admitting: Family Medicine

## 2020-11-30 ENCOUNTER — Ambulatory Visit (INDEPENDENT_AMBULATORY_CARE_PROVIDER_SITE_OTHER): Payer: 59 | Admitting: Family Medicine

## 2020-11-30 VITALS — BP 129/76 | HR 69 | Temp 97.8°F | Ht 66.0 in | Wt 211.0 lb

## 2020-11-30 DIAGNOSIS — Z6834 Body mass index (BMI) 34.0-34.9, adult: Secondary | ICD-10-CM | POA: Diagnosis not present

## 2020-11-30 DIAGNOSIS — Z9189 Other specified personal risk factors, not elsewhere classified: Secondary | ICD-10-CM | POA: Diagnosis not present

## 2020-11-30 DIAGNOSIS — F3289 Other specified depressive episodes: Secondary | ICD-10-CM | POA: Diagnosis not present

## 2020-11-30 DIAGNOSIS — E669 Obesity, unspecified: Secondary | ICD-10-CM

## 2020-11-30 MED ORDER — TOPIRAMATE 50 MG PO TABS: 50.0000 mg | ORAL_TABLET | Freq: Every day | ORAL | 0 refills | Status: DC

## 2020-12-01 ENCOUNTER — Other Ambulatory Visit (INDEPENDENT_AMBULATORY_CARE_PROVIDER_SITE_OTHER): Payer: Self-pay | Admitting: Family Medicine

## 2020-12-01 DIAGNOSIS — F3289 Other specified depressive episodes: Secondary | ICD-10-CM

## 2020-12-01 NOTE — Telephone Encounter (Signed)
Dr.Beasley 

## 2020-12-02 ENCOUNTER — Telehealth (INDEPENDENT_AMBULATORY_CARE_PROVIDER_SITE_OTHER): Payer: Self-pay | Admitting: Family Medicine

## 2020-12-02 NOTE — Progress Notes (Signed)
Chief Complaint:   OBESITY Heather Morales is here to discuss her progress with her obesity treatment plan along with follow-up of her obesity related diagnoses. Heather Morales is on keeping a food journal and adhering to recommended goals of 1400-1600 calories and 80+ grams of protein daily and states she is following her eating plan approximately 80% of the time. Heather Morales states she is walking for 40 minutes 1 time per week.  Today's visit was #: 4 Starting weight: 225 lbs Starting date: 10/15/2020 Today's weight: 211 lbs Today's date: 11/30/2020 Total lbs lost to date: 14 Total lbs lost since last in-office visit: 9  Interim History: Heather Morales has done very well with weight loss. Her fridge broke and she had to do more grab and go eating. She is back on track. She will be traveling to visit family soon and she anticipates extra temptations at that time.  Subjective:   1. Other depression with emotional eating Heather Morales was started on Topamax, and she feels it has helped decrease emotional eating behaviors. She denies daytime somnolence or no dysgeusia was noted.   2. At risk for impaired metabolic function Heather Morales is at increased risk for impaired metabolic function with adding strengthening exercise.  Assessment/Plan:   1. Other depression with emotional eating Behavior modification techniques were discussed today to help Clarity deal with her emotional/non-hunger eating behaviors. We will refill Topamax for 1 month. Orders and follow up as documented in patient record.   - topiramate (TOPAMAX) 50 MG tablet; Take 1 tablet (50 mg total) by mouth at bedtime.  Dispense: 30 tablet; Refill: 0  2. At risk for impaired metabolic function Heather Morales was given approximately 15 minutes of impaired  metabolic function prevention counseling today. We discussed intensive lifestyle modifications today with an emphasis on specific nutrition and exercise instructions and strategies.   Repetitive spaced learning was  employed today to elicit superior memory formation and behavioral change.  3. Class 1 obesity with serious comorbidity and body mass index (BMI) of 34.0 to 34.9 in adult, unspecified obesity type Heather Morales is currently in the action stage of change. As such, her goal is to continue with weight loss efforts. She has agreed to keeping a food journal and adhering to recommended goals of 1400-1600 calories and 80+ grams of protein daily.   Exercise goals: As is, add strengthening.  Behavioral modification strategies: increasing lean protein intake, dealing with family or coworker sabotage and travel eating strategies.  Heather Morales has agreed to follow-up with our clinic in 3 to 4 weeks. She was informed of the importance of frequent follow-up visits to maximize her success with intensive lifestyle modifications for her multiple health conditions.   Objective:   Blood pressure 129/76, pulse 69, temperature 97.8 F (36.6 C), height 5\' 6"  (1.676 m), weight 211 lb (95.7 kg), SpO2 98 %. Body mass index is 34.06 kg/m.  General: Cooperative, alert, well developed, in no acute distress. HEENT: Conjunctivae and lids unremarkable. Cardiovascular: Regular rhythm.  Lungs: Normal work of breathing. Neurologic: No focal deficits.   Lab Results  Component Value Date   CREATININE 0.81 10/15/2020   BUN 11 10/15/2020   NA 140 10/15/2020   K 4.6 10/15/2020   CL 102 10/15/2020   CO2 21 10/15/2020   Lab Results  Component Value Date   ALT 47 (H) 10/15/2020   AST 30 10/15/2020   ALKPHOS 104 10/15/2020   BILITOT 0.3 10/15/2020   Lab Results  Component Value Date   HGBA1C 5.5 10/15/2020  HGBA1C 5.2 10/31/2018   Lab Results  Component Value Date   INSULIN 29.3 (H) 10/15/2020   INSULIN 30.9 (H) 10/31/2018   Lab Results  Component Value Date   TSH 1.510 10/15/2020   Lab Results  Component Value Date   CHOL 185 10/15/2020   HDL 54 10/15/2020   LDLCALC 115 (H) 10/15/2020   TRIG 85 10/15/2020    Lab Results  Component Value Date   WBC 8.0 10/15/2020   HGB 15.4 10/15/2020   HCT 44.8 10/15/2020   MCV 95 10/15/2020   PLT 267 10/15/2020   No results found for: IRON, TIBC, FERRITIN  Attestation Statements:   Reviewed by clinician on day of visit: allergies, medications, problem list, medical history, surgical history, family history, social history, and previous encounter notes.   I, Trixie Dredge, am acting as transcriptionist for Dennard Nip, MD.  I have reviewed the above documentation for accuracy and completeness, and I agree with the above. -  Dennard Nip, MD

## 2020-12-02 NOTE — Telephone Encounter (Signed)
Patient says pharmacy will be contacting office for information regarding topamax prescription.

## 2020-12-02 NOTE — Telephone Encounter (Signed)
Pt called back and stated that this issue has been taken care of and no further action is required by this office.

## 2020-12-02 NOTE — Telephone Encounter (Signed)
Last OV with Dr. Beasley 

## 2020-12-26 ENCOUNTER — Other Ambulatory Visit (INDEPENDENT_AMBULATORY_CARE_PROVIDER_SITE_OTHER): Payer: Self-pay | Admitting: Family Medicine

## 2020-12-26 DIAGNOSIS — F3289 Other specified depressive episodes: Secondary | ICD-10-CM

## 2020-12-28 ENCOUNTER — Ambulatory Visit (INDEPENDENT_AMBULATORY_CARE_PROVIDER_SITE_OTHER): Payer: 59 | Admitting: Family Medicine

## 2020-12-28 ENCOUNTER — Other Ambulatory Visit: Payer: Self-pay

## 2020-12-28 ENCOUNTER — Encounter (INDEPENDENT_AMBULATORY_CARE_PROVIDER_SITE_OTHER): Payer: Self-pay | Admitting: Family Medicine

## 2020-12-28 VITALS — BP 143/83 | HR 67 | Temp 97.7°F | Ht 66.0 in | Wt 210.0 lb

## 2020-12-28 DIAGNOSIS — E8881 Metabolic syndrome: Secondary | ICD-10-CM

## 2020-12-28 DIAGNOSIS — Z9189 Other specified personal risk factors, not elsewhere classified: Secondary | ICD-10-CM | POA: Diagnosis not present

## 2020-12-28 DIAGNOSIS — Z6836 Body mass index (BMI) 36.0-36.9, adult: Secondary | ICD-10-CM

## 2020-12-28 DIAGNOSIS — F3289 Other specified depressive episodes: Secondary | ICD-10-CM | POA: Diagnosis not present

## 2020-12-28 DIAGNOSIS — E88819 Insulin resistance, unspecified: Secondary | ICD-10-CM

## 2020-12-28 MED ORDER — TOPIRAMATE 50 MG PO TABS: 50.0000 mg | ORAL_TABLET | Freq: Every day | ORAL | 0 refills | Status: DC

## 2020-12-30 NOTE — Progress Notes (Signed)
Chief Complaint:   OBESITY Heather Morales is here to discuss her progress with her obesity treatment plan along with follow-up of her obesity related diagnoses. Heather Morales is on keeping a food journal and adhering to recommended goals of 1400-1600 calories and 80 g protein and states she is following her eating plan approximately 70% of the time. Heather Morales states she is doing 0 minutes 0 times per week.  Today's visit was #: 5 Starting weight: 225 lbs Starting date: 10/15/2020 Today's weight: 210 lbs Today's date: 12/28/2020 Total lbs lost to date: 15 lbs Total lbs lost since last in-office visit: 1 lb  Interim History: Heather Morales has been journaling but was on vacation and so she wasn't as compliant to journaling. She realizes she needs to start weighing as measuring food again to get more on track. She is going to a conference in [place] and one in [place] at the end of April.  Subjective:   1. Other depression with emotional eating Heather Morales denies suicidal or homicidal ideations. She is on Topamax with no side effects of medication. She reports some improvement in emotional eating.   2. Insulin resistance Heather Morales reports some carb cravings. She is not on medication.  3. At risk for diabetes mellitus Heather Morales is at higher than average risk for developing diabetes due to obesity.   Assessment/Plan:   1. Other depression with emotional eating Behavior modification techniques were discussed today to help Heather Morales deal with her emotional/non-hunger eating behaviors.  Orders and follow up as documented in patient record.   - topiramate (TOPAMAX) 50 MG tablet; Take 1 tablet (50 mg total) by mouth at bedtime.  Dispense: 30 tablet; Refill: 0  2. Insulin resistance Heather Morales will continue to work on weight loss, exercise, and decreasing simple carbohydrates to help decrease the risk of diabetes. Heather Morales agreed to follow-up with [name] as directed to closely monitor her progress. Repeat labs in May 2022.  3. At  risk for diabetes mellitus Heather Morales was given approximately 15 minutes of diabetes education and counseling today. We discussed intensive lifestyle modifications today with an emphasis on weight loss as well as increasing exercise and decreasing simple carbohydrates in her diet. We also reviewed medication options with an emphasis on risk versus benefit of those discussed.   Repetitive spaced learning was employed today to elicit superior memory formation and behavioral change.  4. Class 2 severe obesity with serious comorbidity and body mass index (BMI) of 36.0 to 36.9 in adult, unspecified obesity type (HCC) Heather Morales is currently in the action stage of change. As such, her goal is to continue with weight loss efforts. She has agreed to keeping a food journal and adhering to recommended goals of 1400-1600 calories and 85+ g protein.   Exercise goals: No exercise has been prescribed at this time.  Behavioral modification strategies: increasing lean protein intake, meal planning and cooking strategies, keeping healthy foods in the home, planning for success and keeping a strict food journal.  Heather Morales has agreed to follow-up with our clinic in 2-3 weeks. She was informed of the importance of frequent follow-up visits to maximize her success with intensive lifestyle modifications for her multiple health conditions.   Objective:   Blood pressure (!) 143/83, pulse 67, temperature 97.7 F (36.5 C), temperature source Oral, height 5\' 6"  (1.676 m), weight 210 lb (95.3 kg), SpO2 99 %. Body mass index is 33.89 kg/m.  General: Cooperative, alert, well developed, in no acute distress. HEENT: Conjunctivae and lids unremarkable. Cardiovascular: Regular rhythm.  Lungs: Normal work of breathing. Neurologic: No focal deficits.   Lab Results  Component Value Date   CREATININE 0.81 10/15/2020   BUN 11 10/15/2020   NA 140 10/15/2020   K 4.6 10/15/2020   CL 102 10/15/2020   CO2 21 10/15/2020   Lab Results   Component Value Date   ALT 47 (H) 10/15/2020   AST 30 10/15/2020   ALKPHOS 104 10/15/2020   BILITOT 0.3 10/15/2020   Lab Results  Component Value Date   HGBA1C 5.5 10/15/2020   HGBA1C 5.2 10/31/2018   Lab Results  Component Value Date   INSULIN 29.3 (H) 10/15/2020   INSULIN 30.9 (H) 10/31/2018   Lab Results  Component Value Date   TSH 1.510 10/15/2020   Lab Results  Component Value Date   CHOL 185 10/15/2020   HDL 54 10/15/2020   LDLCALC 115 (H) 10/15/2020   TRIG 85 10/15/2020   Lab Results  Component Value Date   WBC 8.0 10/15/2020   HGB 15.4 10/15/2020   HCT 44.8 10/15/2020   MCV 95 10/15/2020   PLT 267 10/15/2020     Attestation Statements:   Reviewed by clinician on day of visit: allergies, medications, problem list, medical history, surgical history, family history, social history, and previous encounter notes.  Coral Ceo, am acting as transcriptionist for Coralie Common, MD.   I have reviewed the above documentation for accuracy and completeness, and I agree with the above. - Jinny Blossom, MD

## 2021-01-18 ENCOUNTER — Encounter (INDEPENDENT_AMBULATORY_CARE_PROVIDER_SITE_OTHER): Payer: Self-pay | Admitting: Family Medicine

## 2021-01-18 ENCOUNTER — Ambulatory Visit (INDEPENDENT_AMBULATORY_CARE_PROVIDER_SITE_OTHER): Payer: 59 | Admitting: Family Medicine

## 2021-01-18 ENCOUNTER — Other Ambulatory Visit: Payer: Self-pay

## 2021-01-18 VITALS — BP 133/81 | HR 86 | Temp 97.9°F | Ht 66.0 in | Wt 209.0 lb

## 2021-01-18 DIAGNOSIS — Z6834 Body mass index (BMI) 34.0-34.9, adult: Secondary | ICD-10-CM

## 2021-01-18 DIAGNOSIS — Z9189 Other specified personal risk factors, not elsewhere classified: Secondary | ICD-10-CM

## 2021-01-18 DIAGNOSIS — E669 Obesity, unspecified: Secondary | ICD-10-CM | POA: Diagnosis not present

## 2021-01-18 DIAGNOSIS — F3289 Other specified depressive episodes: Secondary | ICD-10-CM

## 2021-01-18 MED ORDER — TOPIRAMATE 50 MG PO TABS: 50.0000 mg | ORAL_TABLET | Freq: Every day | ORAL | 0 refills | Status: DC

## 2021-01-23 ENCOUNTER — Other Ambulatory Visit (INDEPENDENT_AMBULATORY_CARE_PROVIDER_SITE_OTHER): Payer: Self-pay | Admitting: Family Medicine

## 2021-01-23 DIAGNOSIS — F3289 Other specified depressive episodes: Secondary | ICD-10-CM

## 2021-01-27 NOTE — Progress Notes (Signed)
Chief Complaint:   OBESITY Heather Morales is here to discuss her progress with her obesity treatment plan along with follow-up of her obesity related diagnoses. Heather Morales is on keeping a food journal and adhering to recommended goals of 1400-1600 calories and 85+ grams of protein daily and states she is following her eating plan approximately 50% of the time. Heather Morales states she is walking for 30 minutes 2 times per week.  Today's visit was #: 6 Starting weight: 225 lbs Starting date: 10/15/2020 Today's weight: 209 lbs Today's date: 01/18/2021 Total lbs lost to date: 16 Total lbs lost since last in-office visit: 1  Interim History: Heather Morales has been traveling more and had to eat out more. She is getting back on track but she is struggling to get back to journaling.  Subjective:   1. Other depression with emotional eating Heather Morales continues to do well minimizing her emotional eating behaviors. She notes some mild tingling in her finger tips., but it doesn't bother her.  2. At risk for heart disease Heather Morales is at a higher than average risk for cardiovascular disease due to obesity.   Assessment/Plan:   1. Other depression with emotional eating Behavior modification techniques were discussed today to help Heather Morales deal with her emotional/non-hunger eating behaviors. We will refill Topmax for 1 month, and will continue to monitor. Orders and follow up as documented in patient record.   - topiramate (TOPAMAX) 50 MG tablet; Take 1 tablet (50 mg total) by mouth at bedtime.  Dispense: 30 tablet; Refill: 0  2. At risk for heart disease Heather Morales was given approximately 15 minutes of coronary artery disease prevention counseling today. She is 60 y.o. female and has risk factors for heart disease including obesity. We discussed intensive lifestyle modifications today with an emphasis on specific weight loss instructions and strategies.   Repetitive spaced learning was employed today to elicit superior memory  formation and behavioral change.  3. Obesity with current BMI of 33.8 Heather Morales is currently in the action stage of change. As such, her goal is to continue with weight loss efforts. She has agreed to keeping a food journal and adhering to recommended goals of 1600 calories and 85 grams of protein daily.   Exercise goals: As is.  Behavioral modification strategies: keeping healthy foods in the home and travel eating strategies.  Heather Morales has agreed to follow-up with our clinic in 2 weeks. She was informed of the importance of frequent follow-up visits to maximize her success with intensive lifestyle modifications for her multiple health conditions.   Objective:   Blood pressure 133/81, pulse 86, temperature 97.9 F (36.6 C), height 5\' 6"  (1.676 m), weight 209 lb (94.8 kg), SpO2 96 %. Body mass index is 33.73 kg/m.  General: Cooperative, alert, well developed, in no acute distress. HEENT: Conjunctivae and lids unremarkable. Cardiovascular: Regular rhythm.  Lungs: Normal work of breathing. Neurologic: No focal deficits.   Lab Results  Component Value Date   CREATININE 0.81 10/15/2020   BUN 11 10/15/2020   NA 140 10/15/2020   K 4.6 10/15/2020   CL 102 10/15/2020   CO2 21 10/15/2020   Lab Results  Component Value Date   ALT 47 (H) 10/15/2020   AST 30 10/15/2020   ALKPHOS 104 10/15/2020   BILITOT 0.3 10/15/2020   Lab Results  Component Value Date   HGBA1C 5.5 10/15/2020   HGBA1C 5.2 10/31/2018   Lab Results  Component Value Date   INSULIN 29.3 (H) 10/15/2020   INSULIN 30.9 (  H) 10/31/2018   Lab Results  Component Value Date   TSH 1.510 10/15/2020   Lab Results  Component Value Date   CHOL 185 10/15/2020   HDL 54 10/15/2020   LDLCALC 115 (H) 10/15/2020   TRIG 85 10/15/2020   Lab Results  Component Value Date   WBC 8.0 10/15/2020   HGB 15.4 10/15/2020   HCT 44.8 10/15/2020   MCV 95 10/15/2020   PLT 267 10/15/2020   No results found for: IRON, TIBC,  FERRITIN  Attestation Statements:   Reviewed by clinician on day of visit: allergies, medications, problem list, medical history, surgical history, family history, social history, and previous encounter notes.   I, Trixie Dredge, am acting as transcriptionist for Dennard Nip, MD.  I have reviewed the above documentation for accuracy and completeness, and I agree with the above. -  Dennard Nip, MD

## 2021-02-01 ENCOUNTER — Ambulatory Visit (INDEPENDENT_AMBULATORY_CARE_PROVIDER_SITE_OTHER): Payer: 59 | Admitting: Family Medicine

## 2021-02-01 ENCOUNTER — Encounter (INDEPENDENT_AMBULATORY_CARE_PROVIDER_SITE_OTHER): Payer: Self-pay | Admitting: Family Medicine

## 2021-02-01 ENCOUNTER — Other Ambulatory Visit: Payer: Self-pay

## 2021-02-01 VITALS — BP 122/78 | HR 72 | Temp 98.2°F | Ht 66.0 in | Wt 207.0 lb

## 2021-02-01 DIAGNOSIS — Z9189 Other specified personal risk factors, not elsewhere classified: Secondary | ICD-10-CM | POA: Diagnosis not present

## 2021-02-01 DIAGNOSIS — E559 Vitamin D deficiency, unspecified: Secondary | ICD-10-CM | POA: Diagnosis not present

## 2021-02-01 DIAGNOSIS — F3289 Other specified depressive episodes: Secondary | ICD-10-CM | POA: Diagnosis not present

## 2021-02-01 DIAGNOSIS — Z6834 Body mass index (BMI) 34.0-34.9, adult: Secondary | ICD-10-CM

## 2021-02-01 DIAGNOSIS — E669 Obesity, unspecified: Secondary | ICD-10-CM | POA: Diagnosis not present

## 2021-02-01 MED ORDER — TOPIRAMATE 100 MG PO TABS: 100.0000 mg | ORAL_TABLET | Freq: Every day | ORAL | 0 refills | Status: DC

## 2021-02-02 NOTE — Progress Notes (Signed)
Chief Complaint:   OBESITY  Heather Morales is here to discuss her progress with her obesity treatment plan along with follow-up of her obesity related diagnoses. Heather Morales is on keeping a food journal and adhering to recommended goals of 1600 calories and 85 grams of protein daily and states she is following her eating plan approximately 0% of the time. Heather Morales states she is walking for 45 minutes 2 times per week.  Today's visit was #: 7 Starting weight: 225 lbs Starting date: 10/15/2020 Today's weight: 207 lbs Today's date: 02/01/2021 Total lbs lost to date: 18 Total lbs lost since last in-office visit: 2  Interim History: Heather Morales continues to do well with weight loss, but she has been unable to journal as much. She remains mindful however, and she has been trying to make healthier choices.  Subjective:   1. Vitamin D deficiency Heather Morales is stable on OTC Vit D, and she denies signs of over-replacement.  2. Other depression with emotional eating Heather Morales notes increased stress with family, and this has affected her sleep and her ability to concentrate on her eating.  3. At risk for impaired metabolic function Heather Morales is at increased risk for impaired metabolic function due to current nutrition and muscle mass.  Assessment/Plan:   1. Vitamin D deficiency Low Vitamin D level contributes to fatigue and are associated with obesity, breast, and colon cancer. Heather Morales agreed to continue taking OTC Vitamin D 5,000 IU daily and will follow-up for routine testing of Vitamin D, at least 2-3 times per year to avoid over-replacement.  2. Other depression with emotional eating Behavior modification techniques were discussed today to help Heather Morales deal with her emotional/non-hunger eating behaviors. Heather Morales agreed to increase Topamax to 100 mg qhs with no refills (she is ok to take 1/2 of 100 mg and of 50 mg to equal 75 mg qhs). Orders and follow up as documented in patient record.   - topiramate (TOPAMAX) 100 MG  tablet; Take 1 tablet (100 mg total) by mouth at bedtime.  Dispense: 30 tablet; Refill: 0  3. At risk for impaired metabolic function Heather Morales was given approximately 15 minutes of impaired  metabolic function prevention counseling today. We discussed intensive lifestyle modifications today with an emphasis on specific nutrition and exercise instructions and strategies.   Repetitive spaced learning was employed today to elicit superior memory formation and behavioral change.  4. Obesity BMI today is 63 Heather Morales is currently in the action stage of change. As such, her goal is to continue with weight loss efforts. She has agreed to keeping a food journal and adhering to recommended goals of 1600 calories and 85+ grams of protein daily.   Exercise goals: As is.  Behavioral modification strategies: emotional eating strategies.  Heather Morales has agreed to follow-up with our clinic in 2 weeks. She was informed of the importance of frequent follow-up visits to maximize her success with intensive lifestyle modifications for her multiple health conditions.   Objective:   Blood pressure 122/78, pulse 72, temperature 98.2 F (36.8 C), height 5\' 6"  (1.676 m), weight 207 lb (93.9 kg), SpO2 96 %. Body mass index is 33.41 kg/m.  General: Cooperative, alert, well developed, in no acute distress. HEENT: Conjunctivae and lids unremarkable. Cardiovascular: Regular rhythm.  Lungs: Normal work of breathing. Neurologic: No focal deficits.   Lab Results  Component Value Date   CREATININE 0.81 10/15/2020   BUN 11 10/15/2020   NA 140 10/15/2020   K 4.6 10/15/2020   CL 102 10/15/2020  CO2 21 10/15/2020   Lab Results  Component Value Date   ALT 47 (H) 10/15/2020   AST 30 10/15/2020   ALKPHOS 104 10/15/2020   BILITOT 0.3 10/15/2020   Lab Results  Component Value Date   HGBA1C 5.5 10/15/2020   HGBA1C 5.2 10/31/2018   Lab Results  Component Value Date   INSULIN 29.3 (H) 10/15/2020   INSULIN 30.9 (H)  10/31/2018   Lab Results  Component Value Date   TSH 1.510 10/15/2020   Lab Results  Component Value Date   CHOL 185 10/15/2020   HDL 54 10/15/2020   LDLCALC 115 (H) 10/15/2020   TRIG 85 10/15/2020   Lab Results  Component Value Date   WBC 8.0 10/15/2020   HGB 15.4 10/15/2020   HCT 44.8 10/15/2020   MCV 95 10/15/2020   PLT 267 10/15/2020   No results found for: IRON, TIBC, FERRITIN  Attestation Statements:   Reviewed by clinician on day of visit: allergies, medications, problem list, medical history, surgical history, family history, social history, and previous encounter notes.   I, Trixie Dredge, am acting as transcriptionist for Dennard Nip, MD.  I have reviewed the above documentation for accuracy and completeness, and I agree with the above. -  Dennard Nip, MD

## 2021-02-15 ENCOUNTER — Other Ambulatory Visit: Payer: Self-pay

## 2021-02-15 ENCOUNTER — Ambulatory Visit (INDEPENDENT_AMBULATORY_CARE_PROVIDER_SITE_OTHER): Payer: 59 | Admitting: Family Medicine

## 2021-02-15 ENCOUNTER — Encounter (INDEPENDENT_AMBULATORY_CARE_PROVIDER_SITE_OTHER): Payer: Self-pay | Admitting: Family Medicine

## 2021-02-15 VITALS — BP 140/83 | HR 71 | Temp 97.8°F | Ht 66.0 in | Wt 207.0 lb

## 2021-02-15 DIAGNOSIS — Z6834 Body mass index (BMI) 34.0-34.9, adult: Secondary | ICD-10-CM | POA: Diagnosis not present

## 2021-02-15 DIAGNOSIS — F3289 Other specified depressive episodes: Secondary | ICD-10-CM | POA: Diagnosis not present

## 2021-02-15 DIAGNOSIS — E669 Obesity, unspecified: Secondary | ICD-10-CM | POA: Diagnosis not present

## 2021-02-16 NOTE — Progress Notes (Signed)
Chief Complaint:   OBESITY Heather Morales is here to discuss her progress with her obesity treatment plan along with follow-up of her obesity related diagnoses. Heather Morales is on keeping a food journal and adhering to recommended goals of 1600 calories and 85+ grams of protein daily and states she is following her eating plan approximately 20% of the time. Heather Morales states she is walking for 30 minutes 2 times per week.  Today's visit was #: 8 Starting weight: 225 lbs Starting date: 10/15/2020 Today's weight: 207 lbs Today's date: 02/15/2021 Total lbs lost to date: 18 Total lbs lost since last in-office visit: 0  Interim History: Heather Morales has done well maintaining her weight. She is dealing with some stress eating but has done better with trying to portion control. She has made some "damage control" strategies to help with the next few weeks.  Subjective:   1. Other depression with emotional eating Heather Morales increased her dose of Topamax to 100 mg, and she seems to be tolerating it well. She has no signs of mental fogginess or paresthesia.   Assessment/Plan:   1. Other depression with emotional eating Behavior modification techniques were discussed today to help Heather Morales deal with her emotional/non-hunger eating behaviors. Heather Morales will continue Topamax at 100 mg qhs, and will continue to monitor closely. Orders and follow up as documented in patient record.   2. Obesity with current BMI 33.5 Heather Morales is currently in the action stage of change. As such, her goal is to continue with weight loss efforts. She has agreed to keeping a food journal and adhering to recommended goals of 1600 calories and 85+ grams of protein daily.   Exercise goals: As is.  Behavioral modification strategies: better snacking choices and keeping a strict food journal.  Heather Morales has agreed to follow-up with our clinic in 2 weeks. She was informed of the importance of frequent follow-up visits to maximize her success with intensive  lifestyle modifications for her multiple health conditions.   Objective:   Blood pressure 140/83, pulse 71, temperature 97.8 F (36.6 C), height 5\' 6"  (1.676 m), weight 207 lb (93.9 kg), SpO2 96 %. Body mass index is 33.41 kg/m.  General: Cooperative, alert, well developed, in no acute distress. HEENT: Conjunctivae and lids unremarkable. Cardiovascular: Regular rhythm.  Lungs: Normal work of breathing. Neurologic: No focal deficits.   Lab Results  Component Value Date   CREATININE 0.81 10/15/2020   BUN 11 10/15/2020   NA 140 10/15/2020   K 4.6 10/15/2020   CL 102 10/15/2020   CO2 21 10/15/2020   Lab Results  Component Value Date   ALT 47 (H) 10/15/2020   AST 30 10/15/2020   ALKPHOS 104 10/15/2020   BILITOT 0.3 10/15/2020   Lab Results  Component Value Date   HGBA1C 5.5 10/15/2020   HGBA1C 5.2 10/31/2018   Lab Results  Component Value Date   INSULIN 29.3 (H) 10/15/2020   INSULIN 30.9 (H) 10/31/2018   Lab Results  Component Value Date   TSH 1.510 10/15/2020   Lab Results  Component Value Date   CHOL 185 10/15/2020   HDL 54 10/15/2020   LDLCALC 115 (H) 10/15/2020   TRIG 85 10/15/2020   Lab Results  Component Value Date   WBC 8.0 10/15/2020   HGB 15.4 10/15/2020   HCT 44.8 10/15/2020   MCV 95 10/15/2020   PLT 267 10/15/2020   No results found for: IRON, TIBC, FERRITIN  Attestation Statements:   Reviewed by clinician on day of  visit: allergies, medications, problem list, medical history, surgical history, family history, social history, and previous encounter notes.  Time spent on visit including pre-visit chart review and post-visit care and charting was 30 minutes.    I, Trixie Dredge, am acting as transcriptionist for Dennard Nip, MD.  I have reviewed the above documentation for accuracy and completeness, and I agree with the above. -  Dennard Nip, MD

## 2021-02-19 ENCOUNTER — Other Ambulatory Visit (INDEPENDENT_AMBULATORY_CARE_PROVIDER_SITE_OTHER): Payer: Self-pay | Admitting: Family Medicine

## 2021-02-19 DIAGNOSIS — F3289 Other specified depressive episodes: Secondary | ICD-10-CM

## 2021-02-22 NOTE — Telephone Encounter (Signed)
Pt last seen by Dr. Beasley.  

## 2021-02-27 ENCOUNTER — Other Ambulatory Visit (INDEPENDENT_AMBULATORY_CARE_PROVIDER_SITE_OTHER): Payer: Self-pay | Admitting: Family Medicine

## 2021-02-27 DIAGNOSIS — F3289 Other specified depressive episodes: Secondary | ICD-10-CM

## 2021-03-01 ENCOUNTER — Other Ambulatory Visit: Payer: Self-pay

## 2021-03-01 ENCOUNTER — Ambulatory Visit (INDEPENDENT_AMBULATORY_CARE_PROVIDER_SITE_OTHER): Payer: 59 | Admitting: Family Medicine

## 2021-03-01 ENCOUNTER — Encounter (INDEPENDENT_AMBULATORY_CARE_PROVIDER_SITE_OTHER): Payer: Self-pay | Admitting: Family Medicine

## 2021-03-01 VITALS — BP 127/78 | HR 68 | Temp 97.9°F | Ht 66.0 in | Wt 208.0 lb

## 2021-03-01 DIAGNOSIS — Z6834 Body mass index (BMI) 34.0-34.9, adult: Secondary | ICD-10-CM | POA: Diagnosis not present

## 2021-03-01 DIAGNOSIS — E669 Obesity, unspecified: Secondary | ICD-10-CM

## 2021-03-01 DIAGNOSIS — Z9189 Other specified personal risk factors, not elsewhere classified: Secondary | ICD-10-CM | POA: Diagnosis not present

## 2021-03-01 DIAGNOSIS — F3289 Other specified depressive episodes: Secondary | ICD-10-CM | POA: Diagnosis not present

## 2021-03-01 MED ORDER — TOPIRAMATE 50 MG PO TABS: 50.0000 mg | ORAL_TABLET | Freq: Every day | ORAL | 0 refills | Status: DC

## 2021-03-02 NOTE — Progress Notes (Signed)
Chief Complaint:   OBESITY Heather Morales is here to discuss her progress with her obesity treatment plan along with follow-up of her obesity related diagnoses. Heather Morales is on keeping a food journal and adhering to recommended goals of 1600 calories and 85+ grams of protein daily and states she is following her eating plan approximately 20% of the time. Heather Morales states she is walking for 30 minutes 3 times per week.  Today's visit was #: 9 Starting weight: 225 lbs Starting date: 10/15/2020 Today's weight: 208 lbs Today's date: 03/01/2021 Total lbs lost to date: 17 Total lbs lost since last in-office visit: 0  Interim History: Heather Morales continues to do well with weight loss. She is retaining a bit of fluid today. She is working on increasing her protein but she has been struggling with some carbohydrate cravings especially in the PM.  Subjective:   1. Other depression with emotional eating [name] notes some increased carbohydrate cravings.  2. At risk for heart disease Heather Morales is at a higher than average risk for cardiovascular disease due to obesity.   Assessment/Plan:   1. Other depression with emotional eating Behavior modification techniques were discussed today to help Heather Morales deal with her emotional/non-hunger eating behaviors. We will refill Topamax at 50 mg for 1 month. Orders and follow up as documented in patient record.   - topiramate (TOPAMAX) 50 MG tablet; Take 1 tablet (50 mg total) by mouth at bedtime.  Dispense: 30 tablet; Refill: 0  2. At risk for heart disease Heather Morales was given approximately 15 minutes of coronary artery disease prevention counseling today. She is 60 y.o. female and has risk factors for heart disease including obesity. We discussed intensive lifestyle modifications today with an emphasis on specific weight loss instructions and strategies.   Repetitive spaced learning was employed today to elicit superior memory formation and behavioral change.  3. Obesity with  current BMI 33.6 Heather Morales is currently in the action stage of change. As such, her goal is to continue with weight loss efforts. She has agreed to keeping a food journal and adhering to recommended goals of 1600 calories and 85+ grams of protein daily.   Exercise goals: As is.  Behavioral modification strategies: better snacking choices and emotional eating strategies.  Heather Morales has agreed to follow-up with our clinic in 2 to 3 weeks. She was informed of the importance of frequent follow-up visits to maximize her success with intensive lifestyle modifications for her multiple health conditions.   Objective:   Blood pressure 127/78, pulse 68, temperature 97.9 F (36.6 C), height 5\' 6"  (1.676 m), weight 208 lb (94.3 kg), SpO2 97 %. Body mass index is 33.57 kg/m.  General: Cooperative, alert, well developed, in no acute distress. HEENT: Conjunctivae and lids unremarkable. Cardiovascular: Regular rhythm.  Lungs: Normal work of breathing. Neurologic: No focal deficits.   Lab Results  Component Value Date   CREATININE 0.81 10/15/2020   BUN 11 10/15/2020   NA 140 10/15/2020   K 4.6 10/15/2020   CL 102 10/15/2020   CO2 21 10/15/2020   Lab Results  Component Value Date   ALT 47 (H) 10/15/2020   AST 30 10/15/2020   ALKPHOS 104 10/15/2020   BILITOT 0.3 10/15/2020   Lab Results  Component Value Date   HGBA1C 5.5 10/15/2020   HGBA1C 5.2 10/31/2018   Lab Results  Component Value Date   INSULIN 29.3 (H) 10/15/2020   INSULIN 30.9 (H) 10/31/2018   Lab Results  Component Value Date  TSH 1.510 10/15/2020   Lab Results  Component Value Date   CHOL 185 10/15/2020   HDL 54 10/15/2020   LDLCALC 115 (H) 10/15/2020   TRIG 85 10/15/2020   Lab Results  Component Value Date   WBC 8.0 10/15/2020   HGB 15.4 10/15/2020   HCT 44.8 10/15/2020   MCV 95 10/15/2020   PLT 267 10/15/2020   No results found for: IRON, TIBC, FERRITIN  Attestation Statements:   Reviewed by clinician on day  of visit: allergies, medications, problem list, medical history, surgical history, family history, social history, and previous encounter notes.   I, Trixie Dredge, am acting as transcriptionist for Dennard Nip, MD.  I have reviewed the above documentation for accuracy and completeness, and I agree with the above. -  Dennard Nip, MD

## 2021-03-15 ENCOUNTER — Ambulatory Visit (INDEPENDENT_AMBULATORY_CARE_PROVIDER_SITE_OTHER): Payer: 59 | Admitting: Family Medicine

## 2021-03-15 ENCOUNTER — Encounter (INDEPENDENT_AMBULATORY_CARE_PROVIDER_SITE_OTHER): Payer: Self-pay | Admitting: Family Medicine

## 2021-03-15 ENCOUNTER — Other Ambulatory Visit: Payer: Self-pay

## 2021-03-15 VITALS — BP 136/84 | HR 70 | Temp 97.7°F | Ht 66.0 in | Wt 209.0 lb

## 2021-03-15 DIAGNOSIS — Z6834 Body mass index (BMI) 34.0-34.9, adult: Secondary | ICD-10-CM

## 2021-03-15 DIAGNOSIS — F3289 Other specified depressive episodes: Secondary | ICD-10-CM | POA: Diagnosis not present

## 2021-03-15 DIAGNOSIS — R7301 Impaired fasting glucose: Secondary | ICD-10-CM

## 2021-03-15 DIAGNOSIS — E669 Obesity, unspecified: Secondary | ICD-10-CM

## 2021-03-15 DIAGNOSIS — Z9189 Other specified personal risk factors, not elsewhere classified: Secondary | ICD-10-CM | POA: Diagnosis not present

## 2021-03-16 MED ORDER — BD PEN NEEDLE NANO U/F 32G X 4 MM MISC: 0 refills | Status: DC

## 2021-03-16 MED ORDER — OZEMPIC (0.25 OR 0.5 MG/DOSE) 2 MG/1.5ML ~~LOC~~ SOPN: 0.2500 mg | PEN_INJECTOR | SUBCUTANEOUS | 0 refills | Status: DC

## 2021-03-23 ENCOUNTER — Telehealth (INDEPENDENT_AMBULATORY_CARE_PROVIDER_SITE_OTHER): Payer: Self-pay

## 2021-03-23 NOTE — Telephone Encounter (Signed)
Cover my [facility] called and stated that they will need a new pa sent over the phone with the plan. [phone].  Cover my meds number is [id number] ref [id number], if we have any questions. Please advise

## 2021-03-23 NOTE — Progress Notes (Signed)
Chief Complaint:   OBESITY Heather Morales is here to discuss her progress with her obesity treatment plan along with follow-up of her obesity related diagnoses. Heather Morales is on keeping a food journal and adhering to recommended goals of 1600 calories and 85+ grams of protein daily and states she is following her eating plan approximately 20% of the time. Heather Morales states she is doing 0 minutes 0 times per week.  Today's visit was #: 10 Starting weight: 225 lbs Starting date: 10/15/2020 Today's weight: 209 lbs Today's date: 03/15/2021 Total lbs lost to date: 16 Total lbs lost since last in-office visit: 0  Interim History: Heather Morales feels she is still struggling with increased stress eating but this looks like it will be getting better soon. She does well with meeting her protein goals, but her calories can go too high.  Subjective:   1. Impaired fasting glucose Heather Morales has been working on diet and weight loss, but she still struggles with polyphagia.  2. Other depression with emotional eating Heather Morales is stable on Wellbutrin, and she feels her work and home stress will be improving very soon.  3. At risk for heart disease Heather Morales is at a higher than average risk for cardiovascular disease due to obesity.   Assessment/Plan:   1. Impaired fasting glucose Heather Morales agreed to start Ozempic at 0.25 q weekly with no refills, and nano needles #100 with no refills.  - Semaglutide,0.25 or 0.5MG /DOS, (OZEMPIC, 0.25 OR 0.5 MG/DOSE,) 2 MG/1.5ML SOPN; Inject 0.25 mg into the skin once a week.  Dispense: 1.5 mL; Refill: 0 - Insulin Pen Needle (BD PEN NEEDLE NANO U/F) 32G X 4 MM MISC; Use Nano needle with Ozempic  Dispense: 100 each; Refill: 0  2. Other depression with emotional eating Behavior modification techniques were discussed today to help Heather Morales deal with her emotional/non-hunger eating behaviors. Heather Morales will continue Wellbutrin and Lexapro, and will continue to follow up as directed. Orders and follow up as  documented in patient record.   3. At risk for heart disease Heather Morales was given approximately 15 minutes of coronary artery disease prevention counseling today. She is 60 y.o. female and has risk factors for heart disease including obesity. We discussed intensive lifestyle modifications today with an emphasis on specific weight loss instructions and strategies.   Repetitive spaced learning was employed today to elicit superior memory formation and behavioral change.  4. Obesity with current BMI 33.8 Heather Morales is currently in the action stage of change. As such, her goal is to continue with weight loss efforts. She has agreed to keeping a food journal and adhering to recommended goals of 1600 calories and 85+ grams of protein daily.   Behavioral modification strategies: increasing water intake and keeping a strict food journal.  Heather Morales has agreed to follow-up with our clinic in 3 weeks. She was informed of the importance of frequent follow-up visits to maximize her success with intensive lifestyle modifications for her multiple health conditions.   Objective:   Blood pressure 136/84, pulse 70, temperature 97.7 F (36.5 C), height 5\' 6"  (1.676 m), weight 209 lb (94.8 kg), SpO2 98 %. Body mass index is 33.73 kg/m.  General: Cooperative, alert, well developed, in no acute distress. HEENT: Conjunctivae and lids unremarkable. Cardiovascular: Regular rhythm.  Lungs: Normal work of breathing. Neurologic: No focal deficits.   Lab Results  Component Value Date   CREATININE 0.81 10/15/2020   BUN 11 10/15/2020   NA 140 10/15/2020   K 4.6 10/15/2020   CL 102  10/15/2020   CO2 21 10/15/2020   Lab Results  Component Value Date   ALT 47 (H) 10/15/2020   AST 30 10/15/2020   ALKPHOS 104 10/15/2020   BILITOT 0.3 10/15/2020   Lab Results  Component Value Date   HGBA1C 5.5 10/15/2020   HGBA1C 5.2 10/31/2018   Lab Results  Component Value Date   INSULIN 29.3 (H) 10/15/2020   INSULIN 30.9 (H)  10/31/2018   Lab Results  Component Value Date   TSH 1.510 10/15/2020   Lab Results  Component Value Date   CHOL 185 10/15/2020   HDL 54 10/15/2020   LDLCALC 115 (H) 10/15/2020   TRIG 85 10/15/2020   Lab Results  Component Value Date   WBC 8.0 10/15/2020   HGB 15.4 10/15/2020   HCT 44.8 10/15/2020   MCV 95 10/15/2020   PLT 267 10/15/2020   No results found for: IRON, TIBC, FERRITIN  Attestation Statements:   Reviewed by clinician on day of visit: allergies, medications, problem list, medical history, surgical history, family history, social history, and previous encounter notes.   I, Trixie Dredge, am acting as transcriptionist for Dennard Nip, MD.  I have reviewed the above documentation for accuracy and completeness, and I agree with the above. -  Dennard Nip, MD

## 2021-03-24 NOTE — Telephone Encounter (Signed)
Called [employer], Information was given. Waiting on PA Approval. Ref # [id number]

## 2021-03-28 ENCOUNTER — Other Ambulatory Visit (INDEPENDENT_AMBULATORY_CARE_PROVIDER_SITE_OTHER): Payer: Self-pay | Admitting: Family Medicine

## 2021-03-28 DIAGNOSIS — F3289 Other specified depressive episodes: Secondary | ICD-10-CM

## 2021-03-29 NOTE — Telephone Encounter (Signed)
Dr.Beasley 

## 2021-03-31 ENCOUNTER — Telehealth (INDEPENDENT_AMBULATORY_CARE_PROVIDER_SITE_OTHER): Payer: Self-pay | Admitting: Emergency Medicine

## 2021-03-31 NOTE — Telephone Encounter (Signed)
Prior Auth: Ozempic : Denied

## 2021-04-01 ENCOUNTER — Encounter (INDEPENDENT_AMBULATORY_CARE_PROVIDER_SITE_OTHER): Payer: Self-pay | Admitting: Family Medicine

## 2021-04-01 ENCOUNTER — Ambulatory Visit (INDEPENDENT_AMBULATORY_CARE_PROVIDER_SITE_OTHER): Payer: 59 | Admitting: Family Medicine

## 2021-04-01 ENCOUNTER — Other Ambulatory Visit: Payer: Self-pay

## 2021-04-01 VITALS — BP 138/78 | HR 67 | Temp 97.8°F | Ht 66.0 in | Wt 209.0 lb

## 2021-04-01 DIAGNOSIS — E8881 Metabolic syndrome: Secondary | ICD-10-CM

## 2021-04-01 DIAGNOSIS — E669 Obesity, unspecified: Secondary | ICD-10-CM | POA: Diagnosis not present

## 2021-04-01 DIAGNOSIS — Z6834 Body mass index (BMI) 34.0-34.9, adult: Secondary | ICD-10-CM | POA: Diagnosis not present

## 2021-04-01 MED ORDER — OZEMPIC (0.25 OR 0.5 MG/DOSE) 2 MG/1.5ML ~~LOC~~ SOPN: 0.2500 mg | PEN_INJECTOR | SUBCUTANEOUS | 0 refills | Status: DC

## 2021-04-03 ENCOUNTER — Encounter (INDEPENDENT_AMBULATORY_CARE_PROVIDER_SITE_OTHER): Payer: Self-pay | Admitting: Family Medicine

## 2021-04-07 NOTE — Progress Notes (Signed)
Chief Complaint:   OBESITY Heather Morales is here to discuss her progress with her obesity treatment plan along with follow-up of her obesity related diagnoses. Heather Morales is on keeping a food journal and adhering to recommended goals of 1600 calories and 85+ grams of protein daily and states she is following her eating plan approximately 10% of the time. Heather Morales states she is walking for 60 minutes 3 times per week.  Today's visit was #: 11 Starting weight: 225 lbs Starting date: 10/15/2020 Today's weight: 209 lbs Today's date: 04/01/2021 Total lbs lost to date: 16 Total lbs lost since last in-office visit: 0  Interim History: Heather Morales continues to do well with weight loss despite traveling and celebrations. She is not journaling, but she has remained mindful and has done well meeting her protein goals.  Subjective:   1. Insulin resistance Heather Morales has not started the medication yet due to traveling and not wanting to risk side effects while out of town.  Assessment/Plan:   1. Insulin resistance Heather Morales agreed to start Ozempic at 0.25 mg (no refill needed today). She will continue to work on weight loss, exercise, and decreasing simple carbohydrates to help decrease the risk of diabetes. Heather Morales agreed to follow-up with [name] as directed to closely monitor her progress.  - Semaglutide,0.25 or 0.5MG /DOS, (OZEMPIC, 0.25 OR 0.5 MG/DOSE,) 2 MG/1.5ML SOPN; Inject 0.25 mg into the skin once a week.  Dispense: 1.5 mL; Refill: 0  2. Obesity with current BMI 33.8 Heather Morales is currently in the action stage of change. As such, her goal is to continue with weight loss efforts. She has agreed to keeping a food journal and adhering to recommended goals of 1600 calories and 85+ grams of protein daily.   Exercise goals: As is.  Behavioral modification strategies: increasing water intake.  Clovis has agreed to follow-up with our clinic in 3 weeks. She was informed of the importance of frequent follow-up visits to  maximize her success with intensive lifestyle modifications for her multiple health conditions.   Objective:   Blood pressure 138/78, pulse 67, temperature 97.8 F (36.6 C), height 5\' 6"  (1.676 m), weight 209 lb (94.8 kg), SpO2 97 %. Body mass index is 33.73 kg/m.  General: Cooperative, alert, well developed, in no acute distress. HEENT: Conjunctivae and lids unremarkable. Cardiovascular: Regular rhythm.  Lungs: Normal work of breathing. Neurologic: No focal deficits.   Lab Results  Component Value Date   CREATININE 0.81 10/15/2020   BUN 11 10/15/2020   NA 140 10/15/2020   K 4.6 10/15/2020   CL 102 10/15/2020   CO2 21 10/15/2020   Lab Results  Component Value Date   ALT 47 (H) 10/15/2020   AST 30 10/15/2020   ALKPHOS 104 10/15/2020   BILITOT 0.3 10/15/2020   Lab Results  Component Value Date   HGBA1C 5.5 10/15/2020   HGBA1C 5.2 10/31/2018   Lab Results  Component Value Date   INSULIN 29.3 (H) 10/15/2020   INSULIN 30.9 (H) 10/31/2018   Lab Results  Component Value Date   TSH 1.510 10/15/2020   Lab Results  Component Value Date   CHOL 185 10/15/2020   HDL 54 10/15/2020   LDLCALC 115 (H) 10/15/2020   TRIG 85 10/15/2020   Lab Results  Component Value Date   VD25OH 84.2 10/15/2020   VD25OH 49.3 10/31/2018   Lab Results  Component Value Date   WBC 8.0 10/15/2020   HGB 15.4 10/15/2020   HCT 44.8 10/15/2020   MCV 95  10/15/2020   PLT 267 10/15/2020   No results found for: IRON, TIBC, FERRITIN  Attestation Statements:   Reviewed by clinician on day of visit: allergies, medications, problem list, medical history, surgical history, family history, social history, and previous encounter notes.  Time spent on visit including pre-visit chart review and post-visit care and charting was 20 minutes.    I, Trixie Dredge, am acting as transcriptionist for Dennard Nip, MD.  I have reviewed the above documentation for accuracy and completeness, and I agree with  the above. -  Dennard Nip, MD

## 2021-04-14 ENCOUNTER — Telehealth (INDEPENDENT_AMBULATORY_CARE_PROVIDER_SITE_OTHER): Payer: Self-pay | Admitting: Emergency Medicine

## 2021-04-14 NOTE — Telephone Encounter (Signed)
Dr.Beasley Heather Belanger insurance denied her Ozempic and so now Heather Morales would like to go back on the other medication you once had her on, unless you think there is something else she can try.

## 2021-04-21 ENCOUNTER — Encounter (INDEPENDENT_AMBULATORY_CARE_PROVIDER_SITE_OTHER): Payer: Self-pay

## 2021-04-22 ENCOUNTER — Other Ambulatory Visit: Payer: Self-pay

## 2021-04-22 ENCOUNTER — Encounter (INDEPENDENT_AMBULATORY_CARE_PROVIDER_SITE_OTHER): Payer: Self-pay | Admitting: Family Medicine

## 2021-04-22 ENCOUNTER — Ambulatory Visit (INDEPENDENT_AMBULATORY_CARE_PROVIDER_SITE_OTHER): Payer: 59 | Admitting: Family Medicine

## 2021-04-22 VITALS — BP 142/75 | HR 67 | Temp 97.9°F | Ht 66.0 in | Wt 213.0 lb

## 2021-04-22 DIAGNOSIS — F3289 Other specified depressive episodes: Secondary | ICD-10-CM

## 2021-04-22 DIAGNOSIS — Z9189 Other specified personal risk factors, not elsewhere classified: Secondary | ICD-10-CM

## 2021-04-22 DIAGNOSIS — E669 Obesity, unspecified: Secondary | ICD-10-CM

## 2021-04-22 DIAGNOSIS — Z6834 Body mass index (BMI) 34.0-34.9, adult: Secondary | ICD-10-CM

## 2021-04-22 DIAGNOSIS — E8881 Metabolic syndrome: Secondary | ICD-10-CM

## 2021-04-22 DIAGNOSIS — K760 Fatty (change of) liver, not elsewhere classified: Secondary | ICD-10-CM | POA: Diagnosis not present

## 2021-04-22 DIAGNOSIS — R7301 Impaired fasting glucose: Secondary | ICD-10-CM

## 2021-04-22 MED ORDER — TOPIRAMATE 50 MG PO TABS: 50.0000 mg | ORAL_TABLET | Freq: Every day | ORAL | 0 refills | Status: DC

## 2021-04-22 MED ORDER — OZEMPIC (0.25 OR 0.5 MG/DOSE) 2 MG/1.5ML ~~LOC~~ SOPN: 0.2500 mg | PEN_INJECTOR | SUBCUTANEOUS | 0 refills | Status: DC

## 2021-04-22 MED ORDER — BD PEN NEEDLE NANO U/F 32G X 4 MM MISC: 0 refills | Status: DC

## 2021-04-27 ENCOUNTER — Encounter (INDEPENDENT_AMBULATORY_CARE_PROVIDER_SITE_OTHER): Payer: Self-pay

## 2021-05-03 NOTE — Progress Notes (Signed)
Chief Complaint:   OBESITY Heather Morales is here to discuss her progress with her obesity treatment plan along with follow-up of her obesity related diagnoses. [name] is on keeping a food journal and adhering to recommended goals of 1600 calories and 85+ grams of protein daily and states she is following her eating plan approximately 0% of the time. [name] states she is doing 0 minutes 0 times per week.  Today's visit was #: 12 Starting weight: 225 lbs Starting date: 10/15/2020 Today's weight: 213 lbs Today's date: 04/22/2021 Total lbs lost to date: 12 Total lbs lost since last in-office visit: 0  Interim History: Heather Morales has struggled more in the last few weeks with weight gain. She notes some hunger but mostly hasn't done as well with journaling. She is still mindful of her eating for the most part.  Subjective:   1. NAFLD (nonalcoholic fatty liver disease) Heather Morales has a history of nonalcoholic fatty liver disease with increased ALT. She is at high risk of NASH and she is working on diet and exercise to improve.  2. Other depression with emotional eating Heather Morales had been on Topamax in the past and she feels like it helped. She would like to restart it.  3. At risk for heart disease Sham is at a higher than average risk for cardiovascular disease due to obesity.   Assessment/Plan:   1. NAFLD (nonalcoholic fatty liver disease) We discussed the likely diagnosis of non-alcoholic fatty liver disease today and how this condition is obesity related. [name] was educated the importance of weight loss. [name] agreed to start Ozempic 0.25 mg q weekly with no refills. She is to continue with her weight loss efforts with healthier diet and exercise as an essential part of her treatment plan.  - Semaglutide,0.25 or 0.'5MG'$ /DOS, (OZEMPIC, 0.25 OR 0.5 MG/DOSE,) 2 MG/1.5ML SOPN; Inject 0.25 mg into the skin once a week.  Dispense: 1.5 mL; Refill: 0 - Insulin Pen Needle (BD PEN NEEDLE NANO U/F) 32G X 4 MM  MISC; Use Nano needle with Ozempic  Dispense: 100 each; Refill: 0  2. Other depression with emotional eating Behavior modification techniques were discussed today to help Heather Morales deal with her emotional/non-hunger eating behaviors. We will refill Topamax for 1 month. Orders and follow up as documented in patient record.   - topiramate (TOPAMAX) 50 MG tablet; Take 1 tablet (50 mg total) by mouth at bedtime.  Dispense: 30 tablet; Refill: 0  3. At risk for heart disease Heather Morales was given approximately 15 minutes of coronary artery disease prevention counseling today. She is 60 y.o. female and has risk factors for heart disease including obesity. We discussed intensive lifestyle modifications today with an emphasis on specific weight loss instructions and strategies.   Repetitive spaced learning was employed today to elicit superior memory formation and behavioral change.  4. Obesity with current BMI 34.4 Heather Morales is currently in the action stage of change. As such, her goal is to continue with weight loss efforts. She has agreed to keeping a food journal and adhering to recommended goals of 1600 calories and 85+ grams of protein daily.   Behavioral modification strategies: increasing lean protein intake and meal planning and cooking strategies.  [name] has agreed to follow-up with our clinic in 3 weeks. She was informed of the importance of frequent follow-up visits to maximize her success with intensive lifestyle modifications for her multiple health conditions.   Objective:   Blood pressure (!) 142/75, pulse 67, temperature 97.9 F (36.6 C),  height '5\' 6"'$  (1.676 m), weight 213 lb (96.6 kg), SpO2 97 %. Body mass index is 34.38 kg/m.  General: Cooperative, alert, well developed, in no acute distress. HEENT: Conjunctivae and lids unremarkable. Cardiovascular: Regular rhythm.  Lungs: Normal work of breathing. Neurologic: No focal deficits.   Lab Results  Component Value Date   CREATININE 0.81  10/15/2020   BUN 11 10/15/2020   NA 140 10/15/2020   K 4.6 10/15/2020   CL 102 10/15/2020   CO2 21 10/15/2020   Lab Results  Component Value Date   ALT 47 (H) 10/15/2020   AST 30 10/15/2020   ALKPHOS 104 10/15/2020   BILITOT 0.3 10/15/2020   Lab Results  Component Value Date   HGBA1C 5.5 10/15/2020   HGBA1C 5.2 10/31/2018   Lab Results  Component Value Date   INSULIN 29.3 (H) 10/15/2020   INSULIN 30.9 (H) 10/31/2018   Lab Results  Component Value Date   TSH 1.510 10/15/2020   Lab Results  Component Value Date   CHOL 185 10/15/2020   HDL 54 10/15/2020   LDLCALC 115 (H) 10/15/2020   TRIG 85 10/15/2020   Lab Results  Component Value Date   VD25OH 84.2 10/15/2020   VD25OH 49.3 10/31/2018   Lab Results  Component Value Date   WBC 8.0 10/15/2020   HGB 15.4 10/15/2020   HCT 44.8 10/15/2020   MCV 95 10/15/2020   PLT 267 10/15/2020   No results found for: IRON, TIBC, FERRITIN  Attestation Statements:   Reviewed by clinician on day of visit: allergies, medications, problem list, medical history, surgical history, family history, social history, and previous encounter notes.   I, Trixie Dredge, am acting as transcriptionist for Dennard Nip, MD.  I have reviewed the above documentation for accuracy and completeness, and I agree with the above. -  Dennard Nip, MD

## 2021-05-12 ENCOUNTER — Encounter (INDEPENDENT_AMBULATORY_CARE_PROVIDER_SITE_OTHER): Payer: Self-pay | Admitting: Family Medicine

## 2021-05-12 ENCOUNTER — Ambulatory Visit (INDEPENDENT_AMBULATORY_CARE_PROVIDER_SITE_OTHER): Payer: 59 | Admitting: Family Medicine

## 2021-05-12 ENCOUNTER — Other Ambulatory Visit (HOSPITAL_COMMUNITY): Payer: Self-pay

## 2021-05-12 ENCOUNTER — Other Ambulatory Visit: Payer: Self-pay

## 2021-05-12 VITALS — BP 143/82 | HR 78 | Temp 98.6°F | Ht 66.0 in | Wt 208.0 lb

## 2021-05-12 DIAGNOSIS — Z9189 Other specified personal risk factors, not elsewhere classified: Secondary | ICD-10-CM | POA: Diagnosis not present

## 2021-05-12 DIAGNOSIS — E8881 Metabolic syndrome: Secondary | ICD-10-CM

## 2021-05-12 DIAGNOSIS — Z6834 Body mass index (BMI) 34.0-34.9, adult: Secondary | ICD-10-CM

## 2021-05-12 DIAGNOSIS — F3289 Other specified depressive episodes: Secondary | ICD-10-CM

## 2021-05-12 DIAGNOSIS — E669 Obesity, unspecified: Secondary | ICD-10-CM | POA: Diagnosis not present

## 2021-05-12 MED ORDER — TIRZEPATIDE 2.5 MG/0.5ML ~~LOC~~ SOAJ
2.5000 mg | SUBCUTANEOUS | 0 refills | Status: DC
  Filled 2021-05-12: qty 2, 28d supply, fill #0

## 2021-05-12 NOTE — Progress Notes (Deleted)
Heather Morales

## 2021-05-13 NOTE — Progress Notes (Signed)
Chief Complaint:   OBESITY Heather Morales is here to discuss her progress with her obesity treatment plan along with follow-up of her obesity related diagnoses. Heather Morales is on keeping a food journal and adhering to recommended goals of 1600 calories and 85+ grams of protein daily and states she is following her eating plan approximately 0% of the time. Heather Morales states she is walking for 30 minutes 3 times per week.  Today's visit was #: 13 Starting weight: 225 lbs Starting date: 10/15/2020 Today's weight: 208 lbs Today's date: 05/12/2021 Total lbs lost to date: 17 Total lbs lost since last in-office visit: 5  Interim History: Heather Morales has continued to do well with weight loss. She has gained at her last visit but she is now back down. She is struggling with journaling which may be decreasing her metabolism if she isn't getting enough nutrients.  Subjective:   1. Insulin resistance Heather Morales is unable to get insurance to cover for Ozempic but she is still interested in starting a GLP-1.  2. Other depression with emotional eating Heather Morales is stable on topiramate, and she denies nausea or vomiting. She is working on [employer].  3. At risk for impaired metabolic function Heather Morales is at increased risk for impaired metabolic function due to decreased protein or calories.  Assessment/Plan:   1. Insulin resistance Heather Morales agreed to discontinue Ozempic and start Mounjaro 2.5 mg q weekly with no refills. She will continue to work on weight loss, exercise, and decreasing simple carbohydrates to help decrease the risk of diabetes. Heather Morales agreed to follow-up with [name] as directed to closely monitor her progress.  - tirzepatide [facility]) 2.5 MG/0.5ML Pen; Inject 2.5 mg into the skin once a week.  Dispense: 2 mL; Refill: 0  2. Other depression with emotional eating Behavior modification techniques were discussed today to help Heather Morales deal with her emotional/non-hunger eating behaviors.  Heather Morales will  continue topiramate as is. Orders and follow up as documented in patient record.   3. At risk for impaired metabolic function Heather Morales was given approximately 15 minutes of impaired  metabolic function prevention counseling today. We discussed intensive lifestyle modifications today with an emphasis on specific nutrition and exercise instructions and strategies.   Repetitive spaced learning was employed today to elicit superior memory formation and behavioral change.  4. Obesity with current BMI 33.6 Heather Morales is currently in the action stage of change. As such, her goal is to continue with weight loss efforts. She has agreed to keeping a food journal and adhering to recommended goals of 1600 calories and 85 grams of protein daily.   Exercise goals: As is.  Behavioral modification strategies: no skipping meals.  Heather Morales has agreed to follow-up with our clinic in 3 weeks. She was informed of the importance of frequent follow-up visits to maximize her success with intensive lifestyle modifications for her multiple health conditions.   Objective:   Blood pressure (!) 143/82, pulse 78, temperature 98.6 F (37 C), height '5\' 6"'$  (1.676 m), weight 208 lb (94.3 kg), SpO2 95 %. Body mass index is 33.57 kg/m.  General: Cooperative, alert, well developed, in no acute distress. HEENT: Conjunctivae and lids unremarkable. Cardiovascular: Regular rhythm.  Lungs: Normal work of breathing. Neurologic: No focal deficits.   Lab Results  Component Value Date   CREATININE 0.81 10/15/2020   BUN 11 10/15/2020   NA 140 10/15/2020   K 4.6 10/15/2020   CL 102 10/15/2020   CO2 21 10/15/2020   Lab Results  Component  Value Date   ALT 47 (H) 10/15/2020   AST 30 10/15/2020   ALKPHOS 104 10/15/2020   BILITOT 0.3 10/15/2020   Lab Results  Component Value Date   HGBA1C 5.5 10/15/2020   HGBA1C 5.2 10/31/2018   Lab Results  Component Value Date   INSULIN 29.3 (H) 10/15/2020   INSULIN 30.9 (H) 10/31/2018    Lab Results  Component Value Date   TSH 1.510 10/15/2020   Lab Results  Component Value Date   CHOL 185 10/15/2020   HDL 54 10/15/2020   LDLCALC 115 (H) 10/15/2020   TRIG 85 10/15/2020   Lab Results  Component Value Date   VD25OH 84.2 10/15/2020   VD25OH 49.3 10/31/2018   Lab Results  Component Value Date   WBC 8.0 10/15/2020   HGB 15.4 10/15/2020   HCT 44.8 10/15/2020   MCV 95 10/15/2020   PLT 267 10/15/2020   No results found for: IRON, TIBC, FERRITIN  Attestation Statements:   Reviewed by clinician on day of visit: allergies, medications, problem list, medical history, surgical history, family history, social history, and previous encounter notes.   I, Trixie Dredge, am acting as transcriptionist for Dennard Nip, MD.  I have reviewed the above documentation for accuracy and completeness, and I agree with the above. -  Dennard Nip, MD

## 2021-05-22 ENCOUNTER — Other Ambulatory Visit (INDEPENDENT_AMBULATORY_CARE_PROVIDER_SITE_OTHER): Payer: Self-pay | Admitting: Family Medicine

## 2021-05-22 DIAGNOSIS — F3289 Other specified depressive episodes: Secondary | ICD-10-CM

## 2021-05-24 ENCOUNTER — Encounter (INDEPENDENT_AMBULATORY_CARE_PROVIDER_SITE_OTHER): Payer: Self-pay | Admitting: Emergency Medicine

## 2021-05-24 NOTE — Telephone Encounter (Signed)
Mychart msg has been sent on refill request

## 2021-06-02 ENCOUNTER — Other Ambulatory Visit (INDEPENDENT_AMBULATORY_CARE_PROVIDER_SITE_OTHER): Payer: Self-pay | Admitting: Family Medicine

## 2021-06-02 ENCOUNTER — Encounter (INDEPENDENT_AMBULATORY_CARE_PROVIDER_SITE_OTHER): Payer: Self-pay | Admitting: Family Medicine

## 2021-06-02 ENCOUNTER — Ambulatory Visit (INDEPENDENT_AMBULATORY_CARE_PROVIDER_SITE_OTHER): Payer: 59 | Admitting: Family Medicine

## 2021-06-02 ENCOUNTER — Other Ambulatory Visit: Payer: Self-pay

## 2021-06-02 ENCOUNTER — Other Ambulatory Visit (HOSPITAL_COMMUNITY): Payer: Self-pay

## 2021-06-02 VITALS — BP 131/84 | HR 68 | Temp 98.0°F | Ht 66.0 in | Wt 209.0 lb

## 2021-06-02 DIAGNOSIS — E88819 Insulin resistance, unspecified: Secondary | ICD-10-CM

## 2021-06-02 DIAGNOSIS — Z9189 Other specified personal risk factors, not elsewhere classified: Secondary | ICD-10-CM

## 2021-06-02 DIAGNOSIS — E8881 Metabolic syndrome: Secondary | ICD-10-CM | POA: Diagnosis not present

## 2021-06-02 DIAGNOSIS — E669 Obesity, unspecified: Secondary | ICD-10-CM

## 2021-06-02 DIAGNOSIS — F3289 Other specified depressive episodes: Secondary | ICD-10-CM

## 2021-06-02 DIAGNOSIS — R202 Paresthesia of skin: Secondary | ICD-10-CM

## 2021-06-02 DIAGNOSIS — Z6834 Body mass index (BMI) 34.0-34.9, adult: Secondary | ICD-10-CM | POA: Diagnosis not present

## 2021-06-02 MED ORDER — TIRZEPATIDE 5 MG/0.5ML ~~LOC~~ SOAJ
5.0000 mg | SUBCUTANEOUS | 0 refills | Status: DC
  Filled 2021-06-02: qty 2, 28d supply, fill #0

## 2021-06-02 MED ORDER — TIRZEPATIDE 5 MG/0.5ML ~~LOC~~ SOAJ: 5.0000 mg | SUBCUTANEOUS | 0 refills | Status: DC

## 2021-06-02 NOTE — Progress Notes (Signed)
Chief Complaint:   OBESITY Heather Morales is here to discuss her progress with her obesity treatment plan along with follow-up of her obesity related diagnoses. Heather Morales is on keeping a food journal and adhering to recommended goals of 1600 calories and 85+ grams of protein daily and states she is following her eating plan approximately 0% of the time. Heather Morales states she is walking for 30 minutes 3 times per week.  Today's visit was #: 14 Starting weight: 225 lbs Starting date: 10/15/2020 Today's weight: 209 lbs Today's date: 06/02/2021 Total lbs lost to date: 16 Total lbs lost since last in-office visit: 0  Interim History: Heather Morales continues to work on diet and exercise. She is getting ready to go on vacation to [place], and she is open to discussing traveling and vacation strategies.  Subjective:   1. Insulin resistance Heather Morales started [facility] and she is tolerating it well. She has noticed a little  less hunger in the morning. She denies nausea or vomiting.  2. Paresthesias Heather Morales is on Wellbutrin and Lexapro. She decreased her dose of Topamax due to noticing some right lateral thigh tingling.  3. At risk for impaired metabolic function Heather Morales is at increased risk for impaired metabolic function due to current nutrition and muscle mass.  Assessment/Plan:   1. Insulin resistance Heather Morales agreed to increase Mounjaro to 5 mg q weekly with no refills. She will continue to work on weight loss, exercise, and decreasing simple carbohydrates to help decrease the risk of diabetes. Heather Morales agreed to follow-up with [name] as directed to closely monitor her progress.  - tirzepatide [facility]) 5 MG/0.5ML Pen; Inject 5 mg into the skin once a week.  Dispense: 6 mL; Refill: 0  2. Paresthesias Heather Morales is ok to discontinue Topamax, and she will follow up as directed. Her symptoms may be due to piriformis syndrome. Stretches were discussed and demonstrated.  3. At risk for impaired metabolic function Heather Morales  was given approximately 15 minutes of impaired  metabolic function prevention counseling today. We discussed intensive lifestyle modifications today with an emphasis on specific nutrition and exercise instructions and strategies.   Repetitive spaced learning was employed today to elicit superior memory formation and behavioral change.  4. Obesity with current BMI 33.8 Heather Morales is currently in the action stage of change. As such, her goal is to continue with weight loss efforts. She has agreed to keeping a food journal and adhering to recommended goals of 1600 calories and 85+ grams of protein daily.   Exercise goals: As is.   Behavioral modification strategies: increasing lean protein intake, meal planning and cooking strategies, travel eating strategies, and celebration eating strategies.  Heather Morales has agreed to follow-up with our clinic in 3 to 4 weeks. She was informed of the importance of frequent follow-up visits to maximize her success with intensive lifestyle modifications for her multiple health conditions.   Objective:   Blood pressure 131/84, pulse 68, temperature 98 F (36.7 C), height '5\' 6"'$  (1.676 m), weight 209 lb (94.8 kg), SpO2 99 %. Body mass index is 33.73 kg/m.  General: Cooperative, alert, well developed, in no acute distress. HEENT: Conjunctivae and lids unremarkable. Cardiovascular: Regular rhythm.  Lungs: Normal work of breathing. Neurologic: No focal deficits.   Lab Results  Component Value Date   CREATININE 0.81 10/15/2020   BUN 11 10/15/2020   NA 140 10/15/2020   K 4.6 10/15/2020   CL 102 10/15/2020   CO2 21 10/15/2020   Lab Results  Component Value Date  ALT 47 (H) 10/15/2020   AST 30 10/15/2020   ALKPHOS 104 10/15/2020   BILITOT 0.3 10/15/2020   Lab Results  Component Value Date   HGBA1C 5.5 10/15/2020   HGBA1C 5.2 10/31/2018   Lab Results  Component Value Date   INSULIN 29.3 (H) 10/15/2020   INSULIN 30.9 (H) 10/31/2018   Lab Results   Component Value Date   TSH 1.510 10/15/2020   Lab Results  Component Value Date   CHOL 185 10/15/2020   HDL 54 10/15/2020   LDLCALC 115 (H) 10/15/2020   TRIG 85 10/15/2020   Lab Results  Component Value Date   VD25OH 84.2 10/15/2020   VD25OH 49.3 10/31/2018   Lab Results  Component Value Date   WBC 8.0 10/15/2020   HGB 15.4 10/15/2020   HCT 44.8 10/15/2020   MCV 95 10/15/2020   PLT 267 10/15/2020   No results found for: IRON, TIBC, FERRITIN  Attestation Statements:   Reviewed by clinician on day of visit: allergies, medications, problem list, medical history, surgical history, family history, social history, and previous encounter notes.   I, Trixie Dredge, am acting as transcriptionist for Dennard Nip, MD.  I have reviewed the above documentation for accuracy and completeness, and I agree with the above. -  Dennard Nip, MD

## 2021-06-29 ENCOUNTER — Encounter (INDEPENDENT_AMBULATORY_CARE_PROVIDER_SITE_OTHER): Payer: Self-pay | Admitting: Family Medicine

## 2021-06-29 ENCOUNTER — Ambulatory Visit (INDEPENDENT_AMBULATORY_CARE_PROVIDER_SITE_OTHER): Payer: 59 | Admitting: Family Medicine

## 2021-06-29 ENCOUNTER — Other Ambulatory Visit: Payer: Self-pay

## 2021-06-29 ENCOUNTER — Other Ambulatory Visit (INDEPENDENT_AMBULATORY_CARE_PROVIDER_SITE_OTHER): Payer: Self-pay | Admitting: Family Medicine

## 2021-06-29 VITALS — BP 137/80 | HR 66 | Temp 97.6°F | Ht 66.0 in | Wt 205.0 lb

## 2021-06-29 DIAGNOSIS — E8881 Metabolic syndrome: Secondary | ICD-10-CM | POA: Diagnosis not present

## 2021-06-29 DIAGNOSIS — Z9189 Other specified personal risk factors, not elsewhere classified: Secondary | ICD-10-CM | POA: Diagnosis not present

## 2021-06-29 DIAGNOSIS — E669 Obesity, unspecified: Secondary | ICD-10-CM

## 2021-06-29 DIAGNOSIS — Z6834 Body mass index (BMI) 34.0-34.9, adult: Secondary | ICD-10-CM | POA: Diagnosis not present

## 2021-06-29 MED ORDER — TIRZEPATIDE 7.5 MG/0.5ML ~~LOC~~ SOAJ: 7.5000 mg | SUBCUTANEOUS | 0 refills | Status: DC

## 2021-06-29 NOTE — Telephone Encounter (Signed)
Pt last seen by Dr. Beasley.  

## 2021-06-29 NOTE — Progress Notes (Signed)
Chief Complaint:   OBESITY Heather Morales is here to discuss her progress with her obesity treatment plan along with follow-up of her obesity related diagnoses. Heather Morales is on keeping a food journal and adhering to recommended goals of 1600 calories and 85+ grams of protein daily and states she is following her eating plan approximately 25% of the time. Heather Morales states she is walking for 30 minutes 3 times per week.  Today's visit was #: 15 Starting weight: 225 lbs Starting date: 10/15/2020 Today's weight: 205 lbs Today's date: 06/29/2021 Total lbs lost to date: 20 Total lbs lost since last in-office visit: 4  Interim History: Heather Morales continues to do very well with weight loss even with increased traveling. She has been mindful of her food choices and has continued to be active.  Subjective:   1. Insulin resistance Heather Morales continues to do well with diet and weight loss. She notes decreased polyphagia for the first 2 days after her injection.  2. At risk for nausea Heather Morales is at risk for nausea due to increase in [place].  Assessment/Plan:   1. Insulin resistance Lavender agreed to increase Mounjaro to 7.5 mg q weekly with no refills, and we will follow up in 3 weeks at her next visit. She will continue to work on weight loss, exercise, and decreasing simple carbohydrates to help decrease the risk of diabetes. Heather Morales agreed to follow-up with [name] as directed to closely monitor her progress.  - tirzepatide (MOUNJARO) 7.5 MG/0.5ML Pen; Inject 7.5 mg into the skin once a week.  Dispense: 6 mL; Refill: 0  2. At risk for nausea Heather Morales was given approximately 15 minutes of nausea prevention counseling today. Armandina is at risk for nausea due to her new or current medication. She was encouraged to titrate her medication slowly, make sure to stay hydrated, eat smaller portions throughout the day, and avoid high fat meals.   3. Obesity with current BMI 33.1 Heather Morales is currently in the action  stage of change. As such, her goal is to continue with weight loss efforts. She has agreed to the Category 2 Plan.   Exercise goals: As is.  Behavioral modification strategies: increasing lean protein intake and meal planning and cooking strategies.  Heather Morales has agreed to follow-up with our clinic in 3 weeks. She was informed of the importance of frequent follow-up visits to maximize her success with intensive lifestyle modifications for her multiple health conditions.   Objective:   Blood pressure 137/80, pulse 66, temperature 97.6 F (36.4 C), height 5\' 6"  (1.676 m), weight 205 lb (93 kg), SpO2 96 %. Body mass index is 33.09 kg/m.  General: Cooperative, alert, well developed, in no acute distress. HEENT: Conjunctivae and lids unremarkable. Cardiovascular: Regular rhythm.  Lungs: Normal work of breathing. Neurologic: No focal deficits.   Lab Results  Component Value Date   CREATININE 0.81 10/15/2020   BUN 11 10/15/2020   NA 140 10/15/2020   K 4.6 10/15/2020   CL 102 10/15/2020   CO2 21 10/15/2020   Lab Results  Component Value Date   ALT 47 (H) 10/15/2020   AST 30 10/15/2020   ALKPHOS 104 10/15/2020   BILITOT 0.3 10/15/2020   Lab Results  Component Value Date   HGBA1C 5.5 10/15/2020   HGBA1C 5.2 10/31/2018   Lab Results  Component Value Date   INSULIN 29.3 (H) 10/15/2020   INSULIN 30.9 (H) 10/31/2018   Lab Results  Component Value Date   TSH 1.510 10/15/2020  Lab Results  Component Value Date   CHOL 185 10/15/2020   HDL 54 10/15/2020   LDLCALC 115 (H) 10/15/2020   TRIG 85 10/15/2020   Lab Results  Component Value Date   VD25OH 84.2 10/15/2020   VD25OH 49.3 10/31/2018   Lab Results  Component Value Date   WBC 8.0 10/15/2020   HGB 15.4 10/15/2020   HCT 44.8 10/15/2020   MCV 95 10/15/2020   PLT 267 10/15/2020   No results found for: IRON, TIBC, FERRITIN  Attestation Statements:   Reviewed by clinician on day of visit: allergies, medications,  problem list, medical history, surgical history, family history, social history, and previous encounter notes.   I, Trixie Dredge, am acting as transcriptionist for Dennard Nip, MD.  I have reviewed the above documentation for accuracy and completeness, and I agree with the above. -  Dennard Nip, MD

## 2021-06-30 ENCOUNTER — Other Ambulatory Visit (INDEPENDENT_AMBULATORY_CARE_PROVIDER_SITE_OTHER): Payer: Self-pay | Admitting: Emergency Medicine

## 2021-06-30 ENCOUNTER — Encounter (INDEPENDENT_AMBULATORY_CARE_PROVIDER_SITE_OTHER): Payer: Self-pay | Admitting: Emergency Medicine

## 2021-06-30 ENCOUNTER — Other Ambulatory Visit (HOSPITAL_COMMUNITY): Payer: Self-pay

## 2021-06-30 DIAGNOSIS — E8881 Metabolic syndrome: Secondary | ICD-10-CM

## 2021-06-30 MED ORDER — TIRZEPATIDE 7.5 MG/0.5ML ~~LOC~~ SOAJ
7.5000 mg | SUBCUTANEOUS | 0 refills | Status: DC
  Filled 2021-06-30: qty 2, 28d supply, fill #0

## 2021-07-20 ENCOUNTER — Other Ambulatory Visit: Payer: Self-pay

## 2021-07-20 ENCOUNTER — Encounter (INDEPENDENT_AMBULATORY_CARE_PROVIDER_SITE_OTHER): Payer: Self-pay | Admitting: Family Medicine

## 2021-07-20 ENCOUNTER — Other Ambulatory Visit (HOSPITAL_COMMUNITY): Payer: Self-pay

## 2021-07-20 ENCOUNTER — Ambulatory Visit (INDEPENDENT_AMBULATORY_CARE_PROVIDER_SITE_OTHER): Payer: 59 | Admitting: Family Medicine

## 2021-07-20 VITALS — BP 119/78 | HR 71 | Temp 97.8°F | Ht 66.0 in | Wt 200.0 lb

## 2021-07-20 DIAGNOSIS — E8881 Metabolic syndrome: Secondary | ICD-10-CM | POA: Diagnosis not present

## 2021-07-20 DIAGNOSIS — E669 Obesity, unspecified: Secondary | ICD-10-CM

## 2021-07-20 DIAGNOSIS — Z6834 Body mass index (BMI) 34.0-34.9, adult: Secondary | ICD-10-CM

## 2021-07-20 DIAGNOSIS — E559 Vitamin D deficiency, unspecified: Secondary | ICD-10-CM | POA: Diagnosis not present

## 2021-07-20 DIAGNOSIS — Z9189 Other specified personal risk factors, not elsewhere classified: Secondary | ICD-10-CM

## 2021-07-20 MED ORDER — TIRZEPATIDE 7.5 MG/0.5ML ~~LOC~~ SOAJ
7.5000 mg | SUBCUTANEOUS | 0 refills | Status: DC
  Filled 2021-07-20 – 2021-07-21 (×2): qty 2, 28d supply, fill #0

## 2021-07-20 NOTE — Progress Notes (Signed)
Chief Complaint:   OBESITY Heather Morales is here to discuss her progress with her obesity treatment plan along with follow-up of her obesity related diagnoses. Heather Morales is on the Category 2 Plan and states she is following her eating plan approximately 50% of the time. Heather Morales states she is walking for 30 minutes 3 times per week.  Today's visit was #: 16 Starting weight: 225 lbs Starting date: 10/15/2020 Today's weight: 200 lbs Today's date: 07/20/2021 Total lbs lost to date: 25 Total lbs lost since last in-office visit: 5  Interim History: Heather Morales continues to do well with weight loss. She is not following a structured plan. She is mostly trying to portion control or make smarter choices, and increasing lean protein.   Subjective:   1. Insulin resistance Heather Morales doing well on Mounjaro. Her hunger is controlled and she feels more satisfied.   2. Vitamin D deficiency Heather Morales stable on Vit D OTC, and she denies signs of over-replacement.  3. At risk for impaired metabolic function Heather Morales is at increased risk for impaired metabolic function if protein decreases.  Assessment/Plan:   1. Insulin resistance Heather Morales will continue to work on weight loss, exercise, and decreasing simple carbohydrates to help decrease the risk of diabetes. We will refill Mounjaro for 1 month, and we will recheck labs in 1 month. Heather Morales agreed to follow-up with [name] as directed to closely monitor her progress.  - tirzepatide (MOUNJARO) 7.5 MG/0.5ML Pen; Inject 7.5 mg into the skin once a week.  Dispense: 2 mL; Refill: 0  2. Vitamin D deficiency Low Vitamin D level contributes to fatigue and are associated with obesity, breast, and colon cancer. Heather Morales will continue Vitamin D 5,000 IU daily, and we will recheck labs in 1 month. She will follow-up for routine testing of Vitamin D, at least 2-3 times per year to avoid over-replacement.  3. At risk for impaired metabolic function Heather Morales was given approximately 15 minutes  of impaired  metabolic function prevention counseling today. We discussed intensive lifestyle modifications today with an emphasis on specific nutrition and exercise instructions and strategies.   Repetitive spaced learning was employed today to elicit superior memory formation and behavioral change.  4. Obesity with current BMI 32.3 Heather Morales is currently in the action stage of change. As such, her goal is to continue with weight loss efforts. She has agreed to the Category 2 Plan or keeping a food journal and adhering to recommended goals of 1200-1400 calories and 80+ grams of protein daily.   We will check fasting labs at her next visit.  Exercise goals: As is.  Behavioral modification strategies: increasing lean protein intake.  Heather Morales has agreed to follow-up with our clinic in 3 to 4 weeks. She was informed of the importance of frequent follow-up visits to maximize her success with intensive lifestyle modifications for her multiple health conditions.   Objective:   Blood pressure 119/78, pulse 71, temperature 97.8 F (36.6 C), height 5\' 6"  (1.676 m), weight 200 lb (90.7 kg), SpO2 98 %. Body mass index is 32.28 kg/m.  General: Cooperative, alert, well developed, in no acute distress. HEENT: Conjunctivae and lids unremarkable. Cardiovascular: Regular rhythm.  Lungs: Normal work of breathing. Neurologic: No focal deficits.   Lab Results  Component Value Date   CREATININE 0.81 10/15/2020   BUN 11 10/15/2020   NA 140 10/15/2020   K 4.6 10/15/2020   CL 102 10/15/2020   CO2 21 10/15/2020   Lab Results  Component Value Date  ALT 47 (H) 10/15/2020   AST 30 10/15/2020   ALKPHOS 104 10/15/2020   BILITOT 0.3 10/15/2020   Lab Results  Component Value Date   HGBA1C 5.5 10/15/2020   HGBA1C 5.2 10/31/2018   Lab Results  Component Value Date   INSULIN 29.3 (H) 10/15/2020   INSULIN 30.9 (H) 10/31/2018   Lab Results  Component Value Date   TSH 1.510 10/15/2020   Lab Results   Component Value Date   CHOL 185 10/15/2020   HDL 54 10/15/2020   LDLCALC 115 (H) 10/15/2020   TRIG 85 10/15/2020   Lab Results  Component Value Date   VD25OH 84.2 10/15/2020   VD25OH 49.3 10/31/2018   Lab Results  Component Value Date   WBC 8.0 10/15/2020   HGB 15.4 10/15/2020   HCT 44.8 10/15/2020   MCV 95 10/15/2020   PLT 267 10/15/2020   No results found for: IRON, TIBC, FERRITIN  Attestation Statements:   Reviewed by clinician on day of visit: allergies, medications, problem list, medical history, surgical history, family history, social history, and previous encounter notes.   I, Trixie Dredge, am acting as transcriptionist for Dennard Nip, MD.  I have reviewed the above documentation for accuracy and completeness, and I agree with the above. -  Dennard Nip, MD

## 2021-07-21 ENCOUNTER — Other Ambulatory Visit (HOSPITAL_COMMUNITY): Payer: Self-pay

## 2021-07-22 ENCOUNTER — Other Ambulatory Visit (HOSPITAL_COMMUNITY): Payer: Self-pay

## 2021-08-10 ENCOUNTER — Other Ambulatory Visit: Payer: Self-pay

## 2021-08-10 ENCOUNTER — Ambulatory Visit (INDEPENDENT_AMBULATORY_CARE_PROVIDER_SITE_OTHER): Payer: 59 | Admitting: Family Medicine

## 2021-08-10 ENCOUNTER — Encounter (INDEPENDENT_AMBULATORY_CARE_PROVIDER_SITE_OTHER): Payer: Self-pay | Admitting: Family Medicine

## 2021-08-10 ENCOUNTER — Other Ambulatory Visit (HOSPITAL_COMMUNITY): Payer: Self-pay

## 2021-08-10 VITALS — BP 129/79 | HR 73 | Temp 97.7°F | Ht 66.0 in | Wt 196.0 lb

## 2021-08-10 DIAGNOSIS — Z6834 Body mass index (BMI) 34.0-34.9, adult: Secondary | ICD-10-CM | POA: Diagnosis not present

## 2021-08-10 DIAGNOSIS — E8881 Metabolic syndrome: Secondary | ICD-10-CM | POA: Diagnosis not present

## 2021-08-10 DIAGNOSIS — Z9189 Other specified personal risk factors, not elsewhere classified: Secondary | ICD-10-CM

## 2021-08-10 DIAGNOSIS — E669 Obesity, unspecified: Secondary | ICD-10-CM | POA: Diagnosis not present

## 2021-08-10 DIAGNOSIS — E559 Vitamin D deficiency, unspecified: Secondary | ICD-10-CM

## 2021-08-10 MED ORDER — TIRZEPATIDE 7.5 MG/0.5ML ~~LOC~~ SOAJ
7.5000 mg | SUBCUTANEOUS | 0 refills | Status: DC
  Filled 2021-08-10 – 2021-08-27 (×2): qty 2, 28d supply, fill #0

## 2021-08-10 NOTE — Progress Notes (Signed)
Chief Complaint:   OBESITY Heather Morales is here to discuss her progress with her obesity treatment plan along with follow-up of her obesity related diagnoses. Heather Morales is on the Category 2 Plan or keeping a food journal and adhering to recommended goals of 1200-1400 calories and 80+ grams of protein daily and states she is following her eating plan approximately 75% of the time. Heather Morales states she is walking for 20-30 minutes 2 times per week.  Today's visit was #: 36 Starting weight: 225 lbs Starting date: 10/15/2020 Today's weight: 196 lbs Today's date: 08/10/2021 Total lbs lost to date: 29 Total lbs lost since last in-office visit: 4  Interim History: Heather Morales continues to do well with weight loss. Her hunger is controlled and she is working on increasing lean protein. She is open to discussing Thanksgiving strategies today.  Subjective:   1. Insulin resistance Heather Morales is doing well on Mounjaro, and her hunger is controlled and she feels satisfied. She is working on not skipping meals.  2. Vitamin D deficiency Heather Morales is stable on Vit D OTC, and she is now on 2,000 IU daily, and she is due for labs.  3. At risk for impaired metabolic function Heather Morales is at increased risk for impaired metabolic function due to calories being too low.  Assessment/Plan:   1. Insulin resistance Heather Morales will continue to work on weight loss, exercise, and decreasing simple carbohydrates to help decrease the risk of diabetes. We will check labs today, and we will refill Mounjaro for 1 month. Heather Morales agreed to follow-up with Heather Morales as directed to closely monitor her progress.  - tirzepatide (MOUNJARO) 7.5 MG/0.5ML Pen; Inject 7.5 mg into the skin once a week.  Dispense: 2 mL; Refill: 0 - CMP14+EGFR - TSH - Lipid Panel With LDL/HDL Ratio - Insulin, random - Hemoglobin A1c  2. Vitamin D deficiency Low Vitamin D level contributes to fatigue and are associated with obesity, breast, and colon cancer. We will check labs  today. Heather Morales will continue OTC Vitamin D 2,000 IU daily and will follow-up for routine testing of Vitamin D, at least 2-3 times per year to avoid over-replacement.  - VITAMIN D 25 Hydroxy (Vit-D Deficiency, Fractures)  3. At risk for impaired metabolic function Heather Morales was given approximately 15 minutes of impaired  metabolic function prevention counseling today. We discussed intensive lifestyle modifications today with an emphasis on specific nutrition and exercise instructions and strategies.   Repetitive spaced learning was employed today to elicit superior memory formation and behavioral change.  4. Obesity BMI today is 58 Heather Morales is currently in the action stage of change. As such, her goal is to continue with weight loss efforts. She has agreed to the Category 2 Plan.   Exercise goals: As is.  Behavioral modification strategies: increasing lean protein intake and holiday eating strategies .  Heather Morales has agreed to follow-up with our clinic in 3 to 4 weeks. She was informed of the importance of frequent follow-up visits to maximize her success with intensive lifestyle modifications for her multiple health conditions.   Heather Morales was informed we would discuss her lab results at her next visit unless there is a critical issue that needs to be addressed sooner. Heather Morales agreed to keep her next visit at the agreed upon time to discuss these results.  Objective:   Blood pressure 129/79, pulse 73, temperature 97.7 F (36.5 C), height _0  (1.676 m), weight 196 lb (88.9 kg), SpO2 98 %. Body mass index is 31.64 kg/m.  General:  Cooperative, alert, well developed, in no acute distress. HEENT: Conjunctivae and lids unremarkable. Cardiovascular: Regular rhythm.  Lungs: Normal work of breathing. Neurologic: No focal deficits.   Lab Results  Component Value Date   CREATININE 0.81 10/15/2020   BUN 11 10/15/2020   NA 140 10/15/2020   K 4.6 10/15/2020   CL 102 10/15/2020   CO2 21 10/15/2020    Lab Results  Component Value Date   ALT 47 (H) 10/15/2020   AST 30 10/15/2020   ALKPHOS 104 10/15/2020   BILITOT 0.3 10/15/2020   Lab Results  Component Value Date   HGBA1C 5.5 10/15/2020   HGBA1C 5.2 10/31/2018   Lab Results  Component Value Date   INSULIN 29.3 (H) 10/15/2020   INSULIN 30.9 (H) 10/31/2018   Lab Results  Component Value Date   TSH 1.510 10/15/2020   Lab Results  Component Value Date   CHOL 185 10/15/2020   HDL 54 10/15/2020   LDLCALC 115 (H) 10/15/2020   TRIG 85 10/15/2020   Lab Results  Component Value Date   VD25OH 84.2 10/15/2020   VD25OH 49.3 10/31/2018   Lab Results  Component Value Date   WBC 8.0 10/15/2020   HGB 15.4 10/15/2020   HCT 44.8 10/15/2020   MCV 95 10/15/2020   PLT 267 10/15/2020   No results found for: IRON, TIBC, FERRITIN  Attestation Statements:   Reviewed by clinician on day of visit: allergies, medications, problem list, medical history, surgical history, family history, social history, and previous encounter notes.   I, Trixie Dredge, am acting as transcriptionist for Dennard Nip, MD.  I have reviewed the above documentation for accuracy and completeness, and I agree with the above. -  Dennard Nip, MD

## 2021-08-11 LAB — INSULIN, RANDOM: INSULIN: 33.8 u[IU]/mL — ABNORMAL HIGH (ref 2.6–24.9)

## 2021-08-11 LAB — VITAMIN D 25 HYDROXY (VIT D DEFICIENCY, FRACTURES): Vit D, 25-Hydroxy: 91.7 ng/mL (ref 30.0–100.0)

## 2021-08-11 LAB — LIPID PANEL WITH LDL/HDL RATIO
Cholesterol, Total: 165 mg/dL (ref 100–199)
HDL: 51 mg/dL (ref >39)
LDL Chol Calc (NIH): 99 mg/dL (ref 0–99)
LDL/HDL Ratio: 1.9 ratio (ref 0.0–3.2)
Triglycerides: 79 mg/dL (ref 0–149)
VLDL Cholesterol Cal: 15 mg/dL (ref 5–40)

## 2021-08-11 LAB — CMP14+EGFR
ALT: 25 IU/L (ref 0–32)
AST: 19 IU/L (ref 0–40)
Albumin/Globulin Ratio: 1.9 (ref 1.2–2.2)
Albumin: 4.4 g/dL (ref 3.8–4.8)
Alkaline Phosphatase: 103 IU/L (ref 44–121)
BUN/Creatinine Ratio: 18 (ref 12–28)
BUN: 16 mg/dL (ref 8–27)
Bilirubin Total: 0.4 mg/dL (ref 0.0–1.2)
CO2: 21 mmol/L (ref 20–29)
Calcium: 9.7 mg/dL (ref 8.7–10.3)
Chloride: 105 mmol/L (ref 96–106)
Creatinine, Ser: 0.88 mg/dL (ref 0.57–1.00)
Globulin, Total: 2.3 g/dL (ref 1.5–4.5)
Glucose: 86 mg/dL (ref 70–99)
Potassium: 4.6 mmol/L (ref 3.5–5.2)
Sodium: 141 mmol/L (ref 134–144)
Total Protein: 6.7 g/dL (ref 6.0–8.5)
eGFR: 75 mL/min/{1.73_m2} (ref >59)

## 2021-08-11 LAB — TSH: TSH: 2.22 u[IU]/mL (ref 0.450–4.500)

## 2021-08-11 LAB — HEMOGLOBIN A1C
Est. average glucose Bld gHb Est-mCnc: 103 mg/dL
Hgb A1c MFr Bld: 5.2 % (ref 4.8–5.6)

## 2021-08-12 ENCOUNTER — Encounter (INDEPENDENT_AMBULATORY_CARE_PROVIDER_SITE_OTHER): Payer: Self-pay | Admitting: Family Medicine

## 2021-08-13 ENCOUNTER — Other Ambulatory Visit (HOSPITAL_COMMUNITY): Payer: Self-pay

## 2021-08-21 ENCOUNTER — Other Ambulatory Visit (HOSPITAL_COMMUNITY): Payer: Self-pay

## 2021-08-24 ENCOUNTER — Other Ambulatory Visit (HOSPITAL_COMMUNITY): Payer: Self-pay

## 2021-08-27 ENCOUNTER — Other Ambulatory Visit (HOSPITAL_COMMUNITY): Payer: Self-pay

## 2021-09-06 ENCOUNTER — Other Ambulatory Visit (HOSPITAL_COMMUNITY): Payer: Self-pay

## 2021-09-06 ENCOUNTER — Encounter (INDEPENDENT_AMBULATORY_CARE_PROVIDER_SITE_OTHER): Payer: Self-pay | Admitting: Family Medicine

## 2021-09-06 ENCOUNTER — Other Ambulatory Visit: Payer: Self-pay

## 2021-09-06 ENCOUNTER — Ambulatory Visit (INDEPENDENT_AMBULATORY_CARE_PROVIDER_SITE_OTHER): Payer: 59 | Admitting: Family Medicine

## 2021-09-06 VITALS — BP 126/77 | HR 76 | Temp 97.7°F | Ht 66.0 in | Wt 196.0 lb

## 2021-09-06 DIAGNOSIS — E88819 Insulin resistance, unspecified: Secondary | ICD-10-CM

## 2021-09-06 DIAGNOSIS — E66811 Obesity, class 1: Secondary | ICD-10-CM

## 2021-09-06 DIAGNOSIS — Z9189 Other specified personal risk factors, not elsewhere classified: Secondary | ICD-10-CM

## 2021-09-06 DIAGNOSIS — Z6834 Body mass index (BMI) 34.0-34.9, adult: Secondary | ICD-10-CM | POA: Diagnosis not present

## 2021-09-06 DIAGNOSIS — E8881 Metabolic syndrome: Secondary | ICD-10-CM | POA: Diagnosis not present

## 2021-09-06 DIAGNOSIS — E669 Obesity, unspecified: Secondary | ICD-10-CM | POA: Diagnosis not present

## 2021-09-06 MED ORDER — TIRZEPATIDE 7.5 MG/0.5ML ~~LOC~~ SOAJ
7.5000 mg | SUBCUTANEOUS | 0 refills | Status: DC
Start: 2021-09-06 — End: 2021-09-27
  Filled 2021-09-06: qty 2, 28d supply, fill #0

## 2021-09-06 NOTE — Progress Notes (Signed)
Chief Complaint:   OBESITY Heather Morales is here to discuss her progress with her obesity treatment plan along with follow-up of her obesity related diagnoses. Heather Morales is on the Category 2 Plan and states she is following her eating plan approximately 50% of the time. Heather Morales states she is walking for 20 minutes 3 times per week.  Today's visit was #: 18 Starting weight: 225 lbs Starting date: 10/15/2020 Today's weight: 196 lbs Today's date: 09/06/2021 Total lbs lost to date: 29 Total lbs lost since last in-office visit: 0  Interim History: Heather Morales has done very well maintaining her weight even over Thanksgiving. She notes some increase in PM snacking recently.  Subjective:   1. Insulin resistance Heather Morales is stable on Mounjaro. She notes some mild constipation. She has started to snack more and she may be adding too many simple carbohydrates, especially in the PM.  2. At risk for heart disease Heather Morales is at a higher than average risk for cardiovascular disease due to obesity.   Assessment/Plan:   1. Insulin resistance We will refill Mounjaro for 1 month. Heather Morales will continue to work on weight loss, exercise, and decreasing simple carbohydrates to help decrease the risk of diabetes. Heather Morales agreed to follow-up with [name] as directed to closely monitor her progress.  - tirzepatide (MOUNJARO) 7.5 MG/0.5ML Pen; Inject 7.5 mg into the skin once a week.  Dispense: 2 mL; Refill: 0  2. At risk for heart disease Heather Morales was given approximately 15 minutes of coronary artery disease prevention counseling today. She is 60 y.o. female and has risk factors for heart disease including obesity. We discussed intensive lifestyle modifications today with an emphasis on specific weight loss instructions and strategies.   Repetitive spaced learning was employed today to elicit superior memory formation and behavioral change.  3. Obesity BMI today is 17 Heather Morales is currently in the action stage of change. As such,  her goal is to continue with weight loss efforts. She has agreed to keeping a food journal and adhering to recommended goals of 1200-1400 calories and 85+ grams of protein daily.   Exercise goals: As is.  Behavioral modification strategies: increasing lean protein intake, meal planning and cooking strategies, and better snacking choices.  Heather Morales has agreed to follow-up with our clinic in 3 weeks. She was informed of the importance of frequent follow-up visits to maximize her success with intensive lifestyle modifications for her multiple health conditions.   Objective:   Blood pressure 126/77, pulse 76, temperature 97.7 F (36.5 C), height 5\' 6"  (1.676 m), weight 196 lb (88.9 kg), SpO2 98 %. Body mass index is 31.64 kg/m.  General: Cooperative, alert, well developed, in no acute distress. HEENT: Conjunctivae and lids unremarkable. Cardiovascular: Regular rhythm.  Lungs: Normal work of breathing. Neurologic: No focal deficits.   Lab Results  Component Value Date   CREATININE 0.88 08/10/2021   BUN 16 08/10/2021   NA 141 08/10/2021   K 4.6 08/10/2021   CL 105 08/10/2021   CO2 21 08/10/2021   Lab Results  Component Value Date   ALT 25 08/10/2021   AST 19 08/10/2021   ALKPHOS 103 08/10/2021   BILITOT 0.4 08/10/2021   Lab Results  Component Value Date   HGBA1C 5.2 08/10/2021   HGBA1C 5.5 10/15/2020   HGBA1C 5.2 10/31/2018   Lab Results  Component Value Date   INSULIN 33.8 (H) 08/10/2021   INSULIN 29.3 (H) 10/15/2020   INSULIN 30.9 (H) 10/31/2018   Lab Results  Component  Value Date   TSH 2.220 08/10/2021   Lab Results  Component Value Date   CHOL 165 08/10/2021   HDL 51 08/10/2021   LDLCALC 99 08/10/2021   TRIG 79 08/10/2021   Lab Results  Component Value Date   VD25OH 91.7 08/10/2021   VD25OH 84.2 10/15/2020   VD25OH 49.3 10/31/2018   Lab Results  Component Value Date   WBC 8.0 10/15/2020   HGB 15.4 10/15/2020   HCT 44.8 10/15/2020   MCV 95 10/15/2020    PLT 267 10/15/2020   No results found for: IRON, TIBC, FERRITIN  Attestation Statements:   Reviewed by clinician on day of visit: allergies, medications, problem list, medical history, surgical history, family history, social history, and previous encounter notes.   I, Trixie Dredge, am acting as transcriptionist for Dennard Nip, MD.  I have reviewed the above documentation for accuracy and completeness, and I agree with the above. -  Dennard Nip, MD

## 2021-09-17 ENCOUNTER — Other Ambulatory Visit (HOSPITAL_COMMUNITY): Payer: Self-pay

## 2021-09-27 ENCOUNTER — Other Ambulatory Visit: Payer: Self-pay

## 2021-09-27 ENCOUNTER — Other Ambulatory Visit (HOSPITAL_COMMUNITY): Payer: Self-pay

## 2021-09-27 ENCOUNTER — Ambulatory Visit (INDEPENDENT_AMBULATORY_CARE_PROVIDER_SITE_OTHER): Payer: 59 | Admitting: Family Medicine

## 2021-09-27 VITALS — BP 109/68 | HR 72 | Temp 98.4°F | Ht 66.0 in | Wt 192.0 lb

## 2021-09-27 DIAGNOSIS — Z6834 Body mass index (BMI) 34.0-34.9, adult: Secondary | ICD-10-CM

## 2021-09-27 DIAGNOSIS — E8881 Metabolic syndrome: Secondary | ICD-10-CM

## 2021-09-27 DIAGNOSIS — E669 Obesity, unspecified: Secondary | ICD-10-CM | POA: Diagnosis not present

## 2021-09-27 DIAGNOSIS — Z9189 Other specified personal risk factors, not elsewhere classified: Secondary | ICD-10-CM

## 2021-09-27 DIAGNOSIS — F3289 Other specified depressive episodes: Secondary | ICD-10-CM | POA: Diagnosis not present

## 2021-09-27 MED ORDER — TIRZEPATIDE 7.5 MG/0.5ML ~~LOC~~ SOAJ
7.5000 mg | SUBCUTANEOUS | 0 refills | Status: DC
  Filled 2021-09-27: qty 2, 28d supply, fill #0

## 2021-09-27 MED ORDER — TIRZEPATIDE 10 MG/0.5ML ~~LOC~~ SOAJ
10.0000 mg | SUBCUTANEOUS | 0 refills | Status: DC
  Filled 2021-09-27: qty 2, 28d supply, fill #0

## 2021-09-27 MED ORDER — BUPROPION HCL ER (XL) 300 MG PO TB24
ORAL_TABLET | ORAL | 0 refills | Status: DC
  Filled 2021-09-27: qty 30, 30d supply, fill #0

## 2021-09-27 NOTE — Progress Notes (Signed)
Chief Complaint:   OBESITY Heather Morales is here to discuss her progress with her obesity treatment plan along with follow-up of her obesity related diagnoses. Heather Morales is on the Category 2 Plan and states she is following her eating plan approximately 50% of the time. Heather Morales states she is walking for 20 minutes 2 times per week.  Today's visit was #: 16 Starting weight: 225 lbs Starting date: 10/15/2020 Today's weight: 192 lbs Today's date: 09/27/2021 Total lbs lost to date: 33 Total lbs lost since last in-office visit: 4  Interim History: Heather Morales continues to do well with weight loss on her Category 2 plan, her hunger is controlled and she is working on meeting her protein goals.  Subjective:   1. Insulin resistance Heather Morales is well controlled on Mounjaro. She is working on  not skipping meals and decreasing simple carbohydrates.  2. Other depression with emotional eating Heather Morales is stable on Wellbutrin and her primary care physician usually refills, but she requests a refill for 1 month due to difficulty reaching her primary care physician.   3. At risk for nausea Heather Morales is at risk for nausea due to increased dose of Mounjaro.  Assessment/Plan:   1. Insulin resistance Heather Morales agreed to increase Mounjaro to 10 mg weekly, and we will refill for 1 month. She will continue to work on weight loss, exercise, and decreasing simple carbohydrates to help decrease the risk of diabetes. Heather Morales agreed to follow-up with [name] as directed to closely monitor her progress.  - tirzepatide (MOUNJARO) 10 MG/0.5ML Pen; Inject 10 mg into the skin once a week.  Dispense: 6 mL; Refill: 0  2. Other depression with emotional eating Behavior modification techniques were discussed today to help [name] deal with her emotional/non-hunger eating behaviors. We will refill Wellbutrin XL for 1 month. Orders and follow up as documented in patient record.   - buPROPion (WELLBUTRIN XL) 300 MG 24 hr tablet; TAKE 1 TABLET BY  MOUTH EVERY DAY IN THE MORNING  Dispense: 30 tablet; Refill: 0  3. At risk for nausea Heather Morales was given approximately 15 minutes of nausea prevention counseling today. Heather Morales is at risk for nausea due to her new or current medication. She was encouraged to titrate her medication slowly, make sure to stay hydrated, eat smaller portions throughout the day, and avoid high fat meals.   4. Obesity BMI today is 31.1 Heather Morales is currently in the action stage of change. As such, her goal is to continue with weight loss efforts. She has agreed to the Category 2 Plan.   Exercise goals: As is.  Behavioral modification strategies: meal planning and cooking strategies.  Heather Morales has agreed to follow-up with our clinic in 3 to 4 weeks. She was informed of the importance of frequent follow-up visits to maximize her success with intensive lifestyle modifications for her multiple health conditions.   Objective:   Blood pressure 109/68, pulse 72, temperature 98.4 F (36.9 C), height 5\' 6"  (1.676 m), weight 192 lb (87.1 kg), SpO2 98 %. Body mass index is 30.99 kg/m.  General: Cooperative, alert, well developed, in no acute distress. HEENT: Conjunctivae and lids unremarkable. Cardiovascular: Regular rhythm.  Lungs: Normal work of breathing. Neurologic: No focal deficits.   Lab Results  Component Value Date   CREATININE 0.88 08/10/2021   BUN 16 08/10/2021   NA 141 08/10/2021   K 4.6 08/10/2021   CL 105 08/10/2021   CO2 21 08/10/2021   Lab Results  Component Value Date  ALT 25 08/10/2021   AST 19 08/10/2021   ALKPHOS 103 08/10/2021   BILITOT 0.4 08/10/2021   Lab Results  Component Value Date   HGBA1C 5.2 08/10/2021   HGBA1C 5.5 10/15/2020   HGBA1C 5.2 10/31/2018   Lab Results  Component Value Date   INSULIN 33.8 (H) 08/10/2021   INSULIN 29.3 (H) 10/15/2020   INSULIN 30.9 (H) 10/31/2018   Lab Results  Component Value Date   TSH 2.220 08/10/2021   Lab Results  Component  Value Date   CHOL 165 08/10/2021   HDL 51 08/10/2021   LDLCALC 99 08/10/2021   TRIG 79 08/10/2021   Lab Results  Component Value Date   VD25OH 91.7 08/10/2021   VD25OH 84.2 10/15/2020   VD25OH 49.3 10/31/2018   Lab Results  Component Value Date   WBC 8.0 10/15/2020   HGB 15.4 10/15/2020   HCT 44.8 10/15/2020   MCV 95 10/15/2020   PLT 267 10/15/2020   No results found for: IRON, TIBC, FERRITIN  Attestation Statements:   Reviewed by clinician on day of visit: allergies, medications, problem list, medical history, surgical history, family history, social history, and previous encounter notes.   I, Trixie Dredge, am acting as transcriptionist for Dennard Nip, MD.  I have reviewed the above documentation for accuracy and completeness, and I agree with the above. -  Dennard Nip, MD

## 2021-10-18 ENCOUNTER — Other Ambulatory Visit: Payer: Self-pay

## 2021-10-18 ENCOUNTER — Other Ambulatory Visit (HOSPITAL_COMMUNITY): Payer: Self-pay

## 2021-10-18 ENCOUNTER — Encounter (INDEPENDENT_AMBULATORY_CARE_PROVIDER_SITE_OTHER): Payer: Self-pay | Admitting: Family Medicine

## 2021-10-18 ENCOUNTER — Ambulatory Visit (INDEPENDENT_AMBULATORY_CARE_PROVIDER_SITE_OTHER): Payer: 59 | Admitting: Family Medicine

## 2021-10-18 VITALS — BP 119/77 | HR 72 | Temp 98.0°F | Ht 66.0 in | Wt 189.0 lb

## 2021-10-18 DIAGNOSIS — E669 Obesity, unspecified: Secondary | ICD-10-CM | POA: Diagnosis not present

## 2021-10-18 DIAGNOSIS — K5909 Other constipation: Secondary | ICD-10-CM | POA: Diagnosis not present

## 2021-10-18 DIAGNOSIS — E8881 Metabolic syndrome: Secondary | ICD-10-CM | POA: Diagnosis not present

## 2021-10-18 DIAGNOSIS — Z6830 Body mass index (BMI) 30.0-30.9, adult: Secondary | ICD-10-CM

## 2021-10-18 DIAGNOSIS — Z9189 Other specified personal risk factors, not elsewhere classified: Secondary | ICD-10-CM

## 2021-10-18 DIAGNOSIS — Z6834 Body mass index (BMI) 34.0-34.9, adult: Secondary | ICD-10-CM

## 2021-10-18 MED ORDER — TIRZEPATIDE 10 MG/0.5ML ~~LOC~~ SOAJ
10.0000 mg | SUBCUTANEOUS | 0 refills | Status: DC
  Filled 2021-10-18: qty 2, 28d supply, fill #0

## 2021-10-19 ENCOUNTER — Other Ambulatory Visit (HOSPITAL_COMMUNITY): Payer: Self-pay

## 2021-10-19 NOTE — Progress Notes (Signed)
Chief Complaint:   OBESITY Heather Morales is here to discuss her progress with her obesity treatment plan along with follow-up of her obesity related diagnoses. Heather Morales is on the Category 2 Plan and states she is following her eating plan approximately 80% of the time. Heather Morales states she is walking for 30 minutes 2 times per week.  Today's visit was #: 20 Starting weight: 225 lbs Starting date: 10/15/2020 Today's weight: 189 lbs Today's date: 10/18/2021 Total lbs lost to date: 36 Total lbs lost since last in-office visit: 3  Interim History: Heather Morales continues to do well with weight loss even over the holidays. Her hunger is controlled but she may have cut her calories or protein too low at times.  Subjective:   1. Insulin resistance Heather Morales is doing well on Mounjaro, and she notes decreased polyphagia. She has done well with portion control and weight loss.  2. Other constipation Heather Morales notes increased constipation on Mounjaro. She has not tried any OTC medications yet.  3. At risk for impaired metabolic function Heather Morales is at increased risk for impaired metabolic function if calories or protein decreases too low.  Assessment/Plan:   1. Insulin resistance Heather Morales will continue to work on weight loss, exercise, and decreasing simple carbohydrates to help decrease the risk of diabetes. We will refill Mounjaro for 1 month. Heather Morales agreed to follow-up with [name] as directed to closely monitor her progress.  - tirzepatide (MOUNJARO) 10 MG/0.5ML Pen; Inject 10 mg into the skin once a week.  Dispense: 6 mL; Refill: 0  2. Other constipation Heather Morales is to increase her water intake and start OTC miralax as needed. She was informed that a decrease in bowel movement frequency is normal while losing weight, but stools should not be hard or painful. Orders and follow up as documented in patient record.   3. At risk for impaired metabolic function Heather Morales was given approximately 15 minutes of impaired   metabolic function prevention counseling today. We discussed intensive lifestyle modifications today with an emphasis on specific nutrition and exercise instructions and strategies.   Repetitive spaced learning was employed today to elicit superior memory formation and behavioral change.  4. Obesity BMI today is 29 Heather Morales is currently in the action stage of change. As such, her goal is to continue with weight loss efforts. She has agreed to keeping a food journal and adhering to recommended goals of 1200 calories and 75+ grams of protein daily.   Exercise goals: As is.  Behavioral modification strategies: keeping a strict food journal.  Heather Morales has agreed to follow-up with our clinic in 3 to 4 weeks. She was informed of the importance of frequent follow-up visits to maximize her success with intensive lifestyle modifications for her multiple health conditions.   Objective:   Blood pressure 119/77, pulse 72, temperature 98 F (36.7 C), height 5\' 6"  (1.676 m), weight 189 lb (85.7 kg), SpO2 97 %. Body mass index is 30.51 kg/m.  General: Cooperative, alert, well developed, in no acute distress. HEENT: Conjunctivae and lids unremarkable. Cardiovascular: Regular rhythm.  Lungs: Normal work of breathing. Neurologic: No focal deficits.   Lab Results  Component Value Date   CREATININE 0.88 08/10/2021   BUN 16 08/10/2021   NA 141 08/10/2021   K 4.6 08/10/2021   CL 105 08/10/2021   CO2 21 08/10/2021   Lab Results  Component Value Date   ALT 25 08/10/2021   AST 19 08/10/2021   ALKPHOS 103 08/10/2021   BILITOT 0.4 08/10/2021  Lab Results  Component Value Date   HGBA1C 5.2 08/10/2021   HGBA1C 5.5 10/15/2020   HGBA1C 5.2 10/31/2018   Lab Results  Component Value Date   INSULIN 33.8 (H) 08/10/2021   INSULIN 29.3 (H) 10/15/2020   INSULIN 30.9 (H) 10/31/2018   Lab Results  Component Value Date   TSH 2.220 08/10/2021   Lab Results  Component Value Date   CHOL 165 08/10/2021    HDL 51 08/10/2021   LDLCALC 99 08/10/2021   TRIG 79 08/10/2021   Lab Results  Component Value Date   VD25OH 91.7 08/10/2021   VD25OH 84.2 10/15/2020   VD25OH 49.3 10/31/2018   Lab Results  Component Value Date   WBC 8.0 10/15/2020   HGB 15.4 10/15/2020   HCT 44.8 10/15/2020   MCV 95 10/15/2020   PLT 267 10/15/2020   No results found for: IRON, TIBC, FERRITIN  Attestation Statements:   Reviewed by clinician on day of visit: allergies, medications, problem list, medical history, surgical history, family history, social history, and previous encounter notes.   I, Trixie Dredge, am acting as transcriptionist for Dennard Nip, MD.  I have reviewed the above documentation for accuracy and completeness, and I agree with the above. -  Dennard Nip, MD

## 2021-11-10 ENCOUNTER — Encounter (INDEPENDENT_AMBULATORY_CARE_PROVIDER_SITE_OTHER): Payer: Self-pay | Admitting: Family Medicine

## 2021-11-10 ENCOUNTER — Ambulatory Visit (INDEPENDENT_AMBULATORY_CARE_PROVIDER_SITE_OTHER): Payer: 59 | Admitting: Family Medicine

## 2021-11-10 ENCOUNTER — Other Ambulatory Visit (HOSPITAL_COMMUNITY): Payer: Self-pay

## 2021-11-10 ENCOUNTER — Other Ambulatory Visit: Payer: Self-pay

## 2021-11-10 VITALS — BP 119/73 | HR 78 | Temp 98.6°F | Ht 66.0 in | Wt 188.0 lb

## 2021-11-10 DIAGNOSIS — Z9189 Other specified personal risk factors, not elsewhere classified: Secondary | ICD-10-CM

## 2021-11-10 DIAGNOSIS — Z6830 Body mass index (BMI) 30.0-30.9, adult: Secondary | ICD-10-CM | POA: Diagnosis not present

## 2021-11-10 DIAGNOSIS — E8881 Metabolic syndrome: Secondary | ICD-10-CM

## 2021-11-10 DIAGNOSIS — E559 Vitamin D deficiency, unspecified: Secondary | ICD-10-CM | POA: Diagnosis not present

## 2021-11-10 DIAGNOSIS — E669 Obesity, unspecified: Secondary | ICD-10-CM

## 2021-11-10 MED ORDER — TIRZEPATIDE 10 MG/0.5ML ~~LOC~~ SOAJ
10.0000 mg | SUBCUTANEOUS | 0 refills | Status: DC
  Filled 2021-11-10: qty 2, 28d supply, fill #0

## 2021-11-10 NOTE — Progress Notes (Signed)
Chief Complaint:   OBESITY Heather Morales is here to discuss her progress with her obesity treatment plan along with follow-up of her obesity related diagnoses. Heather Morales is on keeping a food journal and adhering to recommended goals of 1200 calories and 75+ grams of protein daily and states she is following her eating plan approximately 75% of the time. Heather Morales states she is walking for 20 minutes 2 times per week.  Today's visit was #: 21 Starting weight: 225 lbs Starting date: 10/15/2020 Today's weight: 188 lbs Today's date: 11/10/2021 Total lbs lost to date: 37 Total lbs lost since last in-office visit: 1  Interim History: Heather Morales continues to work on diet and weight loss. She has had extra challenges taking care of her sister in-law and meal planning has been somewhat challenging. She is trying to meet her protein goal, but she has had to rely on protein drinks recently.  Subjective:   1. Insulin resistance Heather Morales is doing well on Mounjaro. She notes decreased polyphagia. She is working on meal planning and prepping.  2. Vitamin D deficiency Heather Morales's last Vit D level is almost over-replaced. She is on OTC Vitamin D.  3. At risk for impaired metabolic function Heather Morales is at increased risk for impaired metabolic function if muscle mass decreases.  Assessment/Plan:   1. Insulin resistance Heather Morales will continue her diet, exercise, and decreasing simple carbohydrates to help decrease the risk of diabetes. We will refill Mounjaro for 1 month. Heather Morales agreed to follow-up with [name] as directed to closely monitor her progress.  - tirzepatide (MOUNJARO) 10 MG/0.5ML Pen; Inject 10 mg into the skin once a week.  Dispense: 6 mL; Refill: 0  2. Vitamin D deficiency Heather Morales is to stop OTC Vitamin D, and we will recheck labs in 1 month. She will follow-up for routine testing of Vitamin D, at least 2-3 times per year to avoid over-replacement.  3. At risk for impaired metabolic function Heather Morales was given  approximately 15 minutes of impaired  metabolic function prevention counseling today. We discussed intensive lifestyle modifications today with an emphasis on specific nutrition and exercise instructions and strategies.   Repetitive spaced learning was employed today to elicit superior memory formation and behavioral change.  4. Obesity BMI today is 30.5 Heather Morales is currently in the action stage of change. As such, her goal is to continue with weight loss efforts. She has agreed to keeping a food journal and adhering to recommended goals of 1200 calories and 75+ grams of protein daily.   Heather Morales is to work on increasing "real food" protein.  Exercise goals: As is, add strengthening.  Behavioral modification strategies: increasing lean protein intake and meal planning and cooking strategies.  Heather Morales has agreed to follow-up with our clinic in 3 weeks. She was informed of the importance of frequent follow-up visits to maximize her success with intensive lifestyle modifications for her multiple health conditions.   Objective:   Blood pressure 119/73, pulse 78, temperature 98.6 F (37 C), height 5\' 6"  (1.676 m), weight 188 lb (85.3 kg), SpO2 97 %. Body mass index is 30.34 kg/m.  General: Cooperative, alert, well developed, in no acute distress. HEENT: Conjunctivae and lids unremarkable. Cardiovascular: Regular rhythm.  Lungs: Normal work of breathing. Neurologic: No focal deficits.   Lab Results  Component Value Date   CREATININE 0.88 08/10/2021   BUN 16 08/10/2021   NA 141 08/10/2021   K 4.6 08/10/2021   CL 105 08/10/2021   CO2 21 08/10/2021   Lab  Results  Component Value Date   ALT 25 08/10/2021   AST 19 08/10/2021   ALKPHOS 103 08/10/2021   BILITOT 0.4 08/10/2021   Lab Results  Component Value Date   HGBA1C 5.2 08/10/2021   HGBA1C 5.5 10/15/2020   HGBA1C 5.2 10/31/2018   Lab Results  Component Value Date   INSULIN 33.8 (H) 08/10/2021   INSULIN 29.3 (H) 10/15/2020    INSULIN 30.9 (H) 10/31/2018   Lab Results  Component Value Date   TSH 2.220 08/10/2021   Lab Results  Component Value Date   CHOL 165 08/10/2021   HDL 51 08/10/2021   LDLCALC 99 08/10/2021   TRIG 79 08/10/2021   Lab Results  Component Value Date   VD25OH 91.7 08/10/2021   VD25OH 84.2 10/15/2020   VD25OH 49.3 10/31/2018   Lab Results  Component Value Date   WBC 8.0 10/15/2020   HGB 15.4 10/15/2020   HCT 44.8 10/15/2020   MCV 95 10/15/2020   PLT 267 10/15/2020   No results found for: IRON, TIBC, FERRITIN  Attestation Statements:   Reviewed by clinician on day of visit: allergies, medications, problem list, medical history, surgical history, family history, social history, and previous encounter notes.   I, Trixie Dredge, am acting as transcriptionist for Dennard Nip, MD.  I have reviewed the above documentation for accuracy and completeness, and I agree with the above. -  Dennard Nip, MD

## 2021-11-16 ENCOUNTER — Other Ambulatory Visit (INDEPENDENT_AMBULATORY_CARE_PROVIDER_SITE_OTHER): Payer: Self-pay | Admitting: Family Medicine

## 2021-11-16 ENCOUNTER — Other Ambulatory Visit (HOSPITAL_COMMUNITY): Payer: Self-pay

## 2021-11-16 DIAGNOSIS — F3289 Other specified depressive episodes: Secondary | ICD-10-CM

## 2021-11-16 MED ORDER — BUPROPION HCL ER (XL) 300 MG PO TB24
ORAL_TABLET | ORAL | 0 refills | Status: DC
  Filled 2021-11-16: qty 30, 30d supply, fill #0

## 2021-11-16 NOTE — Telephone Encounter (Signed)
LAST APPOINTMENT DATE: 11/10/21 NEXT APPOINTMENT DATE: 12/06/21   Heather Morales [place] [address] Phone: [phone] Fax: [phone]  Patient is requesting a refill of the following medications: Requested Prescriptions   Pending Prescriptions Disp Refills   buPROPion (WELLBUTRIN XL) 300 MG 24 hr tablet 30 tablet 0    Sig: TAKE 1 TABLET BY MOUTH EVERY DAY IN THE MORNING    Date last filled: 09/27/21 Previously prescribed by Dr. Leafy Ro  Lab Results  Component Value Date   HGBA1C 5.2 08/10/2021   HGBA1C 5.5 10/15/2020   HGBA1C 5.2 10/31/2018   Lab Results  Component Value Date   LDLCALC 99 08/10/2021   CREATININE 0.88 08/10/2021   Lab Results  Component Value Date   VD25OH 91.7 08/10/2021   VD25OH 84.2 10/15/2020   VD25OH 49.3 10/31/2018    BP Readings from Last 3 Encounters:  11/10/21 119/73  10/18/21 119/77  09/27/21 109/68

## 2021-11-17 ENCOUNTER — Other Ambulatory Visit (HOSPITAL_COMMUNITY): Payer: Self-pay

## 2021-12-06 ENCOUNTER — Other Ambulatory Visit: Payer: Self-pay

## 2021-12-06 ENCOUNTER — Encounter (INDEPENDENT_AMBULATORY_CARE_PROVIDER_SITE_OTHER): Payer: Self-pay | Admitting: Family Medicine

## 2021-12-06 ENCOUNTER — Other Ambulatory Visit (HOSPITAL_COMMUNITY): Payer: Self-pay

## 2021-12-06 ENCOUNTER — Ambulatory Visit (INDEPENDENT_AMBULATORY_CARE_PROVIDER_SITE_OTHER): Payer: 59 | Admitting: Family Medicine

## 2021-12-06 VITALS — BP 125/75 | HR 78 | Temp 97.9°F | Ht 66.0 in | Wt 185.0 lb

## 2021-12-06 DIAGNOSIS — E8881 Metabolic syndrome: Secondary | ICD-10-CM

## 2021-12-06 DIAGNOSIS — Z6829 Body mass index (BMI) 29.0-29.9, adult: Secondary | ICD-10-CM

## 2021-12-06 DIAGNOSIS — E669 Obesity, unspecified: Secondary | ICD-10-CM | POA: Diagnosis not present

## 2021-12-06 DIAGNOSIS — Z6834 Body mass index (BMI) 34.0-34.9, adult: Secondary | ICD-10-CM

## 2021-12-06 DIAGNOSIS — Z9189 Other specified personal risk factors, not elsewhere classified: Secondary | ICD-10-CM

## 2021-12-06 MED ORDER — TIRZEPATIDE 10 MG/0.5ML ~~LOC~~ SOAJ
10.0000 mg | SUBCUTANEOUS | 0 refills | Status: DC
  Filled 2021-12-06: qty 2, 28d supply, fill #0

## 2021-12-07 NOTE — Progress Notes (Signed)
Chief Complaint:   OBESITY Heather Morales is here to discuss her progress with her obesity treatment plan along with follow-up of her obesity related diagnoses. Heather Morales is on keeping a food journal and adhering to recommended goals of 1200 calories and 75+ grams of protein daily and states she is following her eating plan approximately 80% of the time. Heather Morales states she is doing 0 minutes 0 times per week.  Today's visit was #: 22 Starting weight: 225 lbs Starting date: 10/15/2020 Today's weight: 185 lbs Today's date: 12/06/2021 Total lbs lost to date: 40 Total lbs lost since last in-office visit: 3  Interim History: Heather Morales continues to do well with weight loss. She has had a lot of stressors in her life with caring for her family and their health issues, which has made time for exercise difficult.  Subjective:   1. Insulin resistance Heather Morales is doing well on Mounjaro. She remembers to take it regularly.  2. At risk for diabetes mellitus Heather Morales is at higher than average risk for developing diabetes due to her obesity.  Assessment/Plan:   1. Insulin resistance We will refill Mounjaro for 1 month. Heather Morales will continue to work on weight loss, exercise, and decreasing simple carbohydrates to help decrease the risk of diabetes. Heather Morales agreed to follow-up with [name] as directed to closely monitor her progress.  - tirzepatide (MOUNJARO) 10 MG/0.5ML Pen; Inject 10 mg into the skin once a week.  Dispense: 6 mL; Refill: 0  2. At risk for diabetes mellitus Heather Morales was given approximately 15 minutes of diabetic education and counseling today. We discussed intensive lifestyle modifications today with an emphasis on weight loss as well as increasing exercise and decreasing simple carbohydrates in her diet. We also reviewed medication options with an emphasis on risk versus benefits of those discussed.  Repetitive spaced learning was employed today to elicit superior memory formation and behavioral  change.  3. Obesity with current BMI of 29.9 Heather Morales is currently in the action stage of change. As such, her goal is to continue with weight loss efforts. She has agreed to keeping a food journal and adhering to recommended goals of 1200 calories and 75+ grams of protein daily.   Exercise goals: All adults should avoid inactivity. Some physical activity is better than none, and adults who participate in any amount of physical activity gain some health benefits.  Behavioral modification strategies: no skipping meals and meal planning and cooking strategies.  Heather Morales has agreed to follow-up with our clinic in 3 weeks. She was informed of the importance of frequent follow-up visits to maximize her success with intensive lifestyle modifications for her multiple health conditions.   Objective:   Blood pressure 125/75, pulse 78, temperature 97.9 F (36.6 C), height 5\' 6"  (1.676 m), weight 185 lb (83.9 kg), SpO2 98 %. Body mass index is 29.86 kg/m.  General: Cooperative, alert, well developed, in no acute distress. HEENT: Conjunctivae and lids unremarkable. Cardiovascular: Regular rhythm.  Lungs: Normal work of breathing. Neurologic: No focal deficits.   Lab Results  Component Value Date   CREATININE 0.88 08/10/2021   BUN 16 08/10/2021   NA 141 08/10/2021   K 4.6 08/10/2021   CL 105 08/10/2021   CO2 21 08/10/2021   Lab Results  Component Value Date   ALT 25 08/10/2021   AST 19 08/10/2021   ALKPHOS 103 08/10/2021   BILITOT 0.4 08/10/2021   Lab Results  Component Value Date   HGBA1C 5.2 08/10/2021   HGBA1C 5.5  10/15/2020   HGBA1C 5.2 10/31/2018   Lab Results  Component Value Date   INSULIN 33.8 (H) 08/10/2021   INSULIN 29.3 (H) 10/15/2020   INSULIN 30.9 (H) 10/31/2018   Lab Results  Component Value Date   TSH 2.220 08/10/2021   Lab Results  Component Value Date   CHOL 165 08/10/2021   HDL 51 08/10/2021   LDLCALC 99 08/10/2021   TRIG 79 08/10/2021   Lab Results   Component Value Date   VD25OH 91.7 08/10/2021   VD25OH 84.2 10/15/2020   VD25OH 49.3 10/31/2018   Lab Results  Component Value Date   WBC 8.0 10/15/2020   HGB 15.4 10/15/2020   HCT 44.8 10/15/2020   MCV 95 10/15/2020   PLT 267 10/15/2020   No results found for: IRON, TIBC, FERRITIN  Attestation Statements:   Reviewed by clinician on day of visit: allergies, medications, problem list, medical history, surgical history, family history, social history, and previous encounter notes.   I, Trixie Dredge, am acting as transcriptionist for Dennard Nip, MD.  I have reviewed the above documentation for accuracy and completeness, and I agree with the above. -  Dennard Nip, MD

## 2021-12-17 ENCOUNTER — Ambulatory Visit: Payer: 59 | Admitting: Plastic Surgery

## 2021-12-17 ENCOUNTER — Other Ambulatory Visit: Payer: Self-pay

## 2021-12-17 ENCOUNTER — Encounter: Payer: Self-pay | Admitting: Plastic Surgery

## 2021-12-17 VITALS — BP 139/89 | HR 79 | Ht 66.0 in | Wt 185.0 lb

## 2021-12-17 DIAGNOSIS — D169 Benign neoplasm of bone and articular cartilage, unspecified: Secondary | ICD-10-CM

## 2021-12-17 NOTE — Progress Notes (Signed)
Referring Provider Cari Caraway, MD [place],  [place] [id number]   CC:  Bony lesions face and scalp  Heather Morales is an 61 y.o. female.  HPI: Patient is a 61 year old with 4 bony lesions.  She has 3 right frontal lesions as well as 1 on the frontal parietal scalp area.  The largest 1 is in the frontal parietal scalp area.  2 anterior easily palpable and the one posterior in the forehead is subtle.  These have grown recently.  Allergies  Allergen Reactions   Levaquin [Levofloxacin]    Penicillins     Outpatient Encounter Medications as of 12/17/2021  Medication Sig   albuterol (PROVENTIL HFA;VENTOLIN HFA) 108 (90 Base) MCG/ACT inhaler Inhale into the lungs every 6 (six) hours as needed for wheezing or shortness of breath.   B Complex-C (B-COMPLEX WITH VITAMIN C) tablet Take 1 tablet by mouth daily.   buPROPion (WELLBUTRIN XL) 300 MG 24 hr tablet TAKE 1 TABLET BY MOUTH EVERY DAY IN THE MORNING   diphenhydrAMINE HCl (BENADRYL ALLERGY PO) Take 25 mg by mouth daily.   escitalopram (LEXAPRO) 5 MG tablet Take 5 mg by mouth daily.   fluticasone (FLONASE) 50 MCG/ACT nasal spray Place into both nostrils daily.   HYDROcodone-acetaminophen (NORCO/VICODIN) 5-325 MG tablet Take 1 tablet by mouth every 4 (four) hours as needed.   ibuprofen (ADVIL) 400 MG tablet Take 400 mg by mouth as needed.   Lansoprazole (PREVACID PO) Take 15 mg by mouth daily.   levocetirizine (XYZAL) 5 MG tablet Take 5 mg by mouth every evening.   montelukast (SINGULAIR) 10 MG tablet Take 10 mg by mouth at bedtime.   tirzepatide (MOUNJARO) 10 MG/0.5ML Pen Inject 10 mg into the skin once a week.   Vitamin D, Ergocalciferol, 2000 units CAPS Take 5,000 Units by mouth every other day.   No facility-administered encounter medications on file as of 12/17/2021.     Past Medical History:  Diagnosis Date   Anxiety    Asthma    Asthma in adult    Back pain    Borderline high blood pressure     Depression    Environmental allergies    Food allergy    GERD (gastroesophageal reflux disease)    High blood pressure    Other fatigue    Prediabetes    Seasonal allergies    Shortness of breath on exertion    Shoulder injury    Sleep apnea    SOB (shortness of breath)    Swelling     Past Surgical History:  Procedure Laterality Date   CERVICAL CONIZATION W/BX  2000   ENDOMETRIAL ABLATION     eyelid surgery     FOOT SURGERY      Family History  Problem Relation Age of Onset   Hypertension Mother    Hyperlipidemia Mother    Depression Mother    Anxiety disorder Mother    Obesity Father    Eating disorder Father    Sleep apnea Father    Anxiety disorder Father     Social History   Social History Narrative   Not on file     Review of Systems General: Denies fevers, chills, weight loss CV: Denies chest pain, shortness of breath, palpitations   Physical Exam Vitals with BMI 12/17/2021 12/06/2021 11/10/2021  Height '5\' 6"'$  '5\' 6"'$  '5\' 6"'$   Weight 185 lbs 185 lbs 188 lbs  BMI 29.87 84.69 62.95  Systolic 284 132 440  Diastolic 89 75 73  Pulse 79 78 78    General:  No acute distress,  Alert and oriented, Non-Toxic, Normal speech and affect HEENT: 2 right frontal lesions less than 1 cm easily palpable nonmobile, bony 1 more posterior right frontal lesion with that is very subtle but still palpable.  1 larger lesion in the frontoparietal region of the scalp that is easily palpable.  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  I discussed with the patient that the neck step would be to get imaging to confirm there is a tissue plane to remove these without a more extensive cranial procedure as well as confirm the benign appearance.Lennice Sites 12/17/2021, 2:24 PM

## 2021-12-27 ENCOUNTER — Encounter (INDEPENDENT_AMBULATORY_CARE_PROVIDER_SITE_OTHER): Payer: Self-pay | Admitting: Family Medicine

## 2021-12-27 ENCOUNTER — Other Ambulatory Visit: Payer: Self-pay

## 2021-12-27 ENCOUNTER — Ambulatory Visit (INDEPENDENT_AMBULATORY_CARE_PROVIDER_SITE_OTHER): Payer: 59 | Admitting: Family Medicine

## 2021-12-27 ENCOUNTER — Other Ambulatory Visit (HOSPITAL_COMMUNITY): Payer: Self-pay

## 2021-12-27 VITALS — BP 132/77 | HR 78 | Ht 66.0 in | Wt 183.0 lb

## 2021-12-27 DIAGNOSIS — E8881 Metabolic syndrome: Secondary | ICD-10-CM | POA: Diagnosis not present

## 2021-12-27 DIAGNOSIS — E669 Obesity, unspecified: Secondary | ICD-10-CM | POA: Diagnosis not present

## 2021-12-27 DIAGNOSIS — E559 Vitamin D deficiency, unspecified: Secondary | ICD-10-CM | POA: Diagnosis not present

## 2021-12-27 DIAGNOSIS — Z9189 Other specified personal risk factors, not elsewhere classified: Secondary | ICD-10-CM

## 2021-12-27 DIAGNOSIS — Z6829 Body mass index (BMI) 29.0-29.9, adult: Secondary | ICD-10-CM

## 2021-12-27 MED ORDER — TIRZEPATIDE 10 MG/0.5ML ~~LOC~~ SOAJ
10.0000 mg | SUBCUTANEOUS | 0 refills | Status: DC
  Filled 2021-12-27: qty 2, 28d supply, fill #0
  Filled 2022-01-19 – 2022-01-20 (×2): qty 2, 28d supply, fill #1
  Filled 2022-02-19: qty 2, 28d supply, fill #2

## 2021-12-29 ENCOUNTER — Other Ambulatory Visit (HOSPITAL_COMMUNITY): Payer: Self-pay

## 2021-12-30 ENCOUNTER — Telehealth (INDEPENDENT_AMBULATORY_CARE_PROVIDER_SITE_OTHER): Payer: Self-pay | Admitting: Family Medicine

## 2021-12-30 ENCOUNTER — Other Ambulatory Visit (HOSPITAL_COMMUNITY): Payer: Self-pay

## 2021-12-30 ENCOUNTER — Encounter (INDEPENDENT_AMBULATORY_CARE_PROVIDER_SITE_OTHER): Payer: Self-pay

## 2021-12-30 NOTE — Progress Notes (Signed)
? ? ? ?Chief Complaint:  ? ?OBESITY ?Heather Morales is here to discuss her progress with her obesity treatment plan along with follow-up of her obesity related diagnoses. Heather Morales is on keeping a food journal and adhering to recommended goals of 1200 calories and 75+ grams of protein daily and states she is following her eating plan approximately 75% of the time. Heather Morales states she is doing 0 minutes 0 times per week. ? ?Today's visit was #: 23 ?Starting weight: 225 lbs ?Starting date: 10/15/2020 ?Today's weight: 183 lbs ?Today's date: 12/27/2021 ?Total lbs lost to date: 65 ?Total lbs lost since last in-office visit: 2 ? ?Interim History: Heather Morales has done well with weight loss. She has had a lot of stress with her sisters health, and she hasn't had as much time to concentrate on her own health or eating. She notes doing less emotional eating behaviors. ? ?Subjective:  ? ?1. Vitamin D deficiency ?Heather Morales had been on OTC Vit D 5,000 IU daily, but she stopped due to being close to over-replaced. ? ?2. Insulin resistance ?Heather Morales is on [place], and she iw working on diet and weight loss. She is trying to meet her protein goals. ? ?3. At risk for hypoglycemia ?Heather Morales is at increased risk for hypoglycemia if calories or protein are too low. ? ?Assessment/Plan:  ? ?1. Vitamin D deficiency ?Heather Morales will continue to hold OTC Vitamin D, and we will recheck labs in 3 weeks at her follow up visit. She will follow-up for routine testing of Vitamin D, at least 2-3 times per year to avoid over-replacement. ? ?2. Insulin resistance ?We will refill Mounjaro for 90 days with no refills, and we will recheck labs at her next visit. Heather Morales will continue to work on weight loss, exercise, and decreasing simple carbohydrates to help decrease the risk of diabetes. Heather Morales agreed to follow-up with [name] as directed to closely monitor her progress. ? ?- tirzepatide (MOUNJARO) 10 MG/0.5ML Pen; Inject 10 mg into the skin once a week.  Dispense: 6 mL; Refill:  0 ? ?3. At risk for hypoglycemia ?Heather Morales was given approximately 15 minutes of counseling today regarding prevention of hypoglycemia. She was advised of symptoms of hypoglycemia. Heather Morales was instructed to avoid skipping meals, eat regular protein rich meals, and schedule low calorie snacks as needed. ? ?Repetitive spaced learning was employed today to elicit superior memory formation and behavioral change. ? ?4. Obesity with current BMI of 29.5 ?Heather Morales is currently in the action stage of change. As such, her goal is to continue with weight loss efforts. She has agreed to keeping a food journal and adhering to recommended goals of 1200 calories and 75+ grams of protein daily.  ? ?We will recheck fasting labs at her next visit. ? ?Behavioral modification strategies: increasing lean protein intake. ? ?Shronda has agreed to follow-up with our clinic in 3 to 4 weeks. She was informed of the importance of frequent follow-up visits to maximize her success with intensive lifestyle modifications for her multiple health conditions.  ? ?Objective:  ? ?Blood pressure 132/77, pulse 78, height '5\' 6"'$  (1.676 m), weight 183 lb (83 kg), SpO2 99 %. ?Body mass index is 29.54 kg/m?. ? ?General: Cooperative, alert, well developed, in no acute distress. ?HEENT: Conjunctivae and lids unremarkable. ?Cardiovascular: Regular rhythm.  ?Lungs: Normal work of breathing. ?Neurologic: No focal deficits.  ? ?Lab Results  ?Component Value Date  ? CREATININE 0.88 08/10/2021  ? BUN 16 08/10/2021  ? NA 141 08/10/2021  ? K 4.6 08/10/2021  ?  CL 105 08/10/2021  ? CO2 21 08/10/2021  ? ?Lab Results  ?Component Value Date  ? ALT 25 08/10/2021  ? AST 19 08/10/2021  ? ALKPHOS 103 08/10/2021  ? BILITOT 0.4 08/10/2021  ? ?Lab Results  ?Component Value Date  ? HGBA1C 5.2 08/10/2021  ? HGBA1C 5.5 10/15/2020  ? HGBA1C 5.2 10/31/2018  ? ?Lab Results  ?Component Value Date  ? INSULIN 33.8 (H) 08/10/2021  ? INSULIN 29.3 (H) 10/15/2020  ? INSULIN 30.9 (H) 10/31/2018   ? ?Lab Results  ?Component Value Date  ? TSH 2.220 08/10/2021  ? ?Lab Results  ?Component Value Date  ? CHOL 165 08/10/2021  ? HDL 51 08/10/2021  ? [place] 99 08/10/2021  ? TRIG 79 08/10/2021  ? ?Lab Results  ?Component Value Date  ? VD25OH 91.7 08/10/2021  ? VD25OH 84.2 10/15/2020  ? VD25OH 49.3 10/31/2018  ? ?Lab Results  ?Component Value Date  ? WBC 8.0 10/15/2020  ? HGB 15.4 10/15/2020  ? HCT 44.8 10/15/2020  ? MCV 95 10/15/2020  ? PLT 267 10/15/2020  ? ?No results found for: IRON, TIBC, FERRITIN ? ?Attestation Statements:  ? ?Reviewed by clinician on day of visit: allergies, medications, problem list, medical history, surgical history, family history, social history, and previous encounter notes. ? ? ?I, Trixie Dredge, am acting as transcriptionist for Dennard Nip, MD. ? ?I have reviewed the above documentation for accuracy and completeness, and I agree with the above. -  Dennard Nip, MD ? ? ?

## 2021-12-30 NOTE — Telephone Encounter (Signed)
Prior authorization denied for [facility]. Patient already uses copay card. Patient sent denial message via mychart. ?

## 2022-01-07 ENCOUNTER — Other Ambulatory Visit (HOSPITAL_COMMUNITY): Payer: Self-pay

## 2022-01-17 ENCOUNTER — Ambulatory Visit (INDEPENDENT_AMBULATORY_CARE_PROVIDER_SITE_OTHER): Payer: 59 | Admitting: Family Medicine

## 2022-01-19 ENCOUNTER — Other Ambulatory Visit (HOSPITAL_COMMUNITY): Payer: Self-pay

## 2022-01-19 ENCOUNTER — Ambulatory Visit (INDEPENDENT_AMBULATORY_CARE_PROVIDER_SITE_OTHER): Payer: 59 | Admitting: Family Medicine

## 2022-01-19 ENCOUNTER — Encounter (INDEPENDENT_AMBULATORY_CARE_PROVIDER_SITE_OTHER): Payer: Self-pay | Admitting: Family Medicine

## 2022-01-19 VITALS — BP 122/82 | HR 76 | Temp 97.5°F | Ht 66.0 in | Wt 183.0 lb

## 2022-01-19 DIAGNOSIS — E8881 Metabolic syndrome: Secondary | ICD-10-CM | POA: Diagnosis not present

## 2022-01-19 DIAGNOSIS — Z6829 Body mass index (BMI) 29.0-29.9, adult: Secondary | ICD-10-CM | POA: Diagnosis not present

## 2022-01-19 DIAGNOSIS — E559 Vitamin D deficiency, unspecified: Secondary | ICD-10-CM | POA: Diagnosis not present

## 2022-01-19 DIAGNOSIS — E669 Obesity, unspecified: Secondary | ICD-10-CM

## 2022-01-19 DIAGNOSIS — Z9189 Other specified personal risk factors, not elsewhere classified: Secondary | ICD-10-CM

## 2022-01-20 ENCOUNTER — Other Ambulatory Visit (HOSPITAL_COMMUNITY): Payer: Self-pay

## 2022-01-20 LAB — CMP14+EGFR
ALT: 25 IU/L (ref 0–32)
AST: 17 IU/L (ref 0–40)
Albumin/Globulin Ratio: 2 (ref 1.2–2.2)
Albumin: 4.5 g/dL (ref 3.8–4.8)
Alkaline Phosphatase: 107 IU/L (ref 44–121)
BUN/Creatinine Ratio: 34 — ABNORMAL HIGH (ref 12–28)
BUN: 24 mg/dL (ref 8–27)
Bilirubin Total: 0.3 mg/dL (ref 0.0–1.2)
CO2: 16 mmol/L — ABNORMAL LOW (ref 20–29)
Calcium: 9.9 mg/dL (ref 8.7–10.3)
Chloride: 104 mmol/L (ref 96–106)
Creatinine, Ser: 0.71 mg/dL (ref 0.57–1.00)
Globulin, Total: 2.3 g/dL (ref 1.5–4.5)
Glucose: 89 mg/dL (ref 70–99)
Potassium: 4.8 mmol/L (ref 3.5–5.2)
Sodium: 142 mmol/L (ref 134–144)
Total Protein: 6.8 g/dL (ref 6.0–8.5)
eGFR: 97 mL/min/{1.73_m2} (ref >59)

## 2022-01-20 LAB — VITAMIN D 25 HYDROXY (VIT D DEFICIENCY, FRACTURES): Vit D, 25-Hydroxy: 64.9 ng/mL (ref 30.0–100.0)

## 2022-01-20 LAB — HEMOGLOBIN A1C
Est. average glucose Bld gHb Est-mCnc: 105 mg/dL
Hgb A1c MFr Bld: 5.3 % (ref 4.8–5.6)

## 2022-01-21 ENCOUNTER — Other Ambulatory Visit (HOSPITAL_COMMUNITY): Payer: Self-pay

## 2022-01-24 NOTE — Progress Notes (Signed)
? ? ? ?Chief Complaint:  ? ?OBESITY ?Heather Morales is here to discuss her progress with her obesity treatment plan along with follow-up of her obesity related diagnoses. Heather Morales is on keeping a food journal and adhering to recommended goals of 1200 calories and 75+ grams of protein daily and states she is following her eating plan approximately 0% of the time. Heather Morales states she is doing 0 minutes 0 times per week. ? ?Today's visit was #: 24 ?Starting weight: 225 lbs  ?Starting date: 10/15/2020 ?Today's weight: 183 lbs ?Today's date: 01/19/2022 ?Total lbs lost to date: 24 ?Total lbs lost since last in-office visit: 0 ? ?Interim History: Heather Morales has done well with maintaining her weight loss. She has not been journaling regularly, but has tried to be mindful of her food choices. She has had a lot of stress in her life recently and she hasn't been able to concentrate on weight loss.  ? ?Subjective:  ? ?1. Vitamin D deficiency ?[name] is on Vitamin D OTC occasionally. She is due to have labs rechecked as she was close to being over-replaced earlier.  ? ?2. Insulin resistance ?[name] is doing well on Mounjaro. She continue to work on eating healthier, with no side effects noted.  ? ?3. At risk for diabetes mellitus ?Heather Morales is at higher than average risk for developing diabetes due to her obesity. ? ?Assessment/Plan:  ? ?1. Vitamin D deficiency ?We will check labs today, and we will follow up at Jazmeen's next visit.  ? ?- VITAMIN D 25 Hydroxy (Vit-D Deficiency, Fractures) ? ?2. Insulin resistance ?We will check labs today. Lashonna will continue [facility] as is.  ? ?- Hemoglobin A1c ?- CMP14+EGFR ? ?3. At risk for diabetes mellitus ?Heather Morales was given approximately 15 minutes of diabetic education and counseling today. We discussed intensive lifestyle modifications today with an emphasis on weight loss as well as increasing exercise and decreasing simple carbohydrates in her diet. We also reviewed medication options with an emphasis on  risk versus benefits of those discussed. ? ?Repetitive spaced learning was employed today to elicit superior memory formation and behavioral change. ? ?4. Obesity with current BMI of 29.6 ?Heather Morales is currently in the action stage of change. As such, her goal is to maintain her weight loss for the near future. She has agreed to practicing portion control and making smarter food choices, such as increasing vegetables and decreasing simple carbohydrates.  ? ?Behavioral modification strategies: increasing lean protein intake. ? ?Heather Morales has agreed to follow-up with our clinic in 3 weeks. She was informed of the importance of frequent follow-up visits to maximize her success with intensive lifestyle modifications for her multiple health conditions.  ? ?Heather Morales was informed we would discuss her lab results at her next visit unless there is a critical issue that needs to be addressed sooner. Heather Morales agreed to keep her next visit at the agreed upon time to discuss these results. ? ?Objective:  ? ?Blood pressure 122/82, pulse 76, temperature (!) 97.5 ?F (36.4 ?C), height _0  (1.676 m), weight 183 lb (83 kg), SpO2 98 %. ?Body mass index is 29.54 kg/m?. ? ?General: Cooperative, alert, well developed, in no acute distress. ?HEENT: Conjunctivae and lids unremarkable. ?Cardiovascular: Regular rhythm.  ?Lungs: Normal work of breathing. ?Neurologic: No focal deficits.  ? ?Lab Results  ?Component Value Date  ? CREATININE 0.71 01/19/2022  ? BUN 24 01/19/2022  ? NA 142 01/19/2022  ? K 4.8 01/19/2022  ? CL 104 01/19/2022  ? CO2 16 (L) 01/19/2022  ? ?  Lab Results  ?Component Value Date  ? ALT 25 01/19/2022  ? AST 17 01/19/2022  ? ALKPHOS 107 01/19/2022  ? BILITOT 0.3 01/19/2022  ? ?Lab Results  ?Component Value Date  ? HGBA1C 5.3 01/19/2022  ? HGBA1C 5.2 08/10/2021  ? HGBA1C 5.5 10/15/2020  ? HGBA1C 5.2 10/31/2018  ? ?Lab Results  ?Component Value Date  ? INSULIN 33.8 (H) 08/10/2021  ? INSULIN 29.3 (H) 10/15/2020  ? INSULIN 30.9 (H)  10/31/2018  ? ?Lab Results  ?Component Value Date  ? TSH 2.220 08/10/2021  ? ?Lab Results  ?Component Value Date  ? CHOL 165 08/10/2021  ? HDL 51 08/10/2021  ? [place] 99 08/10/2021  ? TRIG 79 08/10/2021  ? ?Lab Results  ?Component Value Date  ? VD25OH 64.9 01/19/2022  ? VD25OH 91.7 08/10/2021  ? VD25OH 84.2 10/15/2020  ? ?Lab Results  ?Component Value Date  ? WBC 8.0 10/15/2020  ? HGB 15.4 10/15/2020  ? HCT 44.8 10/15/2020  ? MCV 95 10/15/2020  ? PLT 267 10/15/2020  ? ?No results found for: IRON, TIBC, FERRITIN ? ?Attestation Statements:  ? ?Reviewed by clinician on day of visit: allergies, medications, problem list, medical history, surgical history, family history, social history, and previous encounter notes. ? ? ?I, Trixie Dredge, am acting as transcriptionist for Dennard Nip, MD. ? ?I have reviewed the above documentation for accuracy and completeness, and I agree with the above. -  Dennard Nip, MD ? ? ?

## 2022-02-08 ENCOUNTER — Ambulatory Visit (INDEPENDENT_AMBULATORY_CARE_PROVIDER_SITE_OTHER): Payer: 59 | Admitting: Family Medicine

## 2022-02-08 ENCOUNTER — Encounter (INDEPENDENT_AMBULATORY_CARE_PROVIDER_SITE_OTHER): Payer: Self-pay | Admitting: Family Medicine

## 2022-02-08 VITALS — BP 121/79 | HR 74 | Temp 97.7°F | Ht 66.0 in | Wt 181.0 lb

## 2022-02-08 DIAGNOSIS — E559 Vitamin D deficiency, unspecified: Secondary | ICD-10-CM

## 2022-02-08 DIAGNOSIS — Z6829 Body mass index (BMI) 29.0-29.9, adult: Secondary | ICD-10-CM

## 2022-02-08 DIAGNOSIS — E669 Obesity, unspecified: Secondary | ICD-10-CM

## 2022-02-08 DIAGNOSIS — E8881 Metabolic syndrome: Secondary | ICD-10-CM | POA: Diagnosis not present

## 2022-02-11 ENCOUNTER — Ambulatory Visit (HOSPITAL_BASED_OUTPATIENT_CLINIC_OR_DEPARTMENT_OTHER)
Admission: RE | Admit: 2022-02-11 | Discharge: 2022-02-11 | Disposition: A | Discharge status: Home / Self Care | Payer: 59 | Source: Ambulatory Visit | Attending: Plastic Surgery | Admitting: Plastic Surgery

## 2022-02-11 ENCOUNTER — Ambulatory Visit (HOSPITAL_BASED_OUTPATIENT_CLINIC_OR_DEPARTMENT_OTHER): Payer: 59

## 2022-02-11 DIAGNOSIS — D169 Benign neoplasm of bone and articular cartilage, unspecified: Secondary | ICD-10-CM | POA: Diagnosis not present

## 2022-02-14 ENCOUNTER — Encounter: Payer: Self-pay | Admitting: Plastic Surgery

## 2022-02-14 ENCOUNTER — Telehealth: Payer: Self-pay | Admitting: Plastic Surgery

## 2022-02-19 ENCOUNTER — Other Ambulatory Visit (HOSPITAL_COMMUNITY): Payer: Self-pay

## 2022-02-23 NOTE — Progress Notes (Signed)
Chief Complaint:   OBESITY Heather Morales is here to discuss her progress with her obesity treatment plan along with follow-up of her obesity related diagnoses. Heather Morales is on practicing portion control and making smarter food choices, such as increasing vegetables and decreasing simple carbohydrates and states she is following her eating plan approximately 50% of the time. Heather Morales states she is walking for 20 minutes 3 times per week.  Today's visit was #: 25 Starting weight: 225 lbs Starting date: 10/15/2020 Today's weight: 181 lbs Today's date: 02/08/2022 Total lbs lost to date: 7 Total lbs lost since last in-office visit: 2  Interim History: Heather Morales continues to do well with weight loss. She has increased walking recently, and is working on portion control and Psychologist, counselling.   Subjective:   1. Insulin resistance Heather Morales's level is improving on Mounjaro.   2. Vitamin D deficiency Heather Morales is stable on OTC Vitamin D 5,000 IU daily.  Assessment/Plan:   1. Insulin resistance Heather Morales will continue Mounjaro, and will continue to work on weight loss, exercise, and decreasing simple carbohydrates to help decrease the risk of diabetes. Heather Morales agreed to follow-up with [name] as directed to closely monitor her progress.  2. Vitamin D deficiency Heather Morales will continue OTC Vitamin D 5,000 IU daily and will follow-up for routine testing of Vitamin D, at least 2-3 times per year to avoid over-replacement.  3. Obesity, Current BMI 29.3 Heather Morales is currently in the action stage of change. As such, her goal is to continue with weight loss efforts. She has agreed to practicing portion control and making smarter food choices, such as increasing vegetables and decreasing simple carbohydrates.   Exercise goals: As is.   Behavioral modification strategies: increasing lean protein intake.  Heather Morales has agreed to follow-up with our clinic in 3 weeks. She was informed of the importance of frequent follow-up  visits to maximize her success with intensive lifestyle modifications for her multiple health conditions.   Objective:   Blood pressure 121/79, pulse 74, temperature 97.7 F (36.5 C), height '5\' 6"'$  (1.676 m), weight 181 lb (82.1 kg), SpO2 99 %. Body mass index is 29.21 kg/m.  General: Cooperative, alert, well developed, in no acute distress. HEENT: Conjunctivae and lids unremarkable. Cardiovascular: Regular rhythm.  Lungs: Normal work of breathing. Neurologic: No focal deficits.   Lab Results  Component Value Date   CREATININE 0.71 01/19/2022   BUN 24 01/19/2022   NA 142 01/19/2022   K 4.8 01/19/2022   CL 104 01/19/2022   CO2 16 (L) 01/19/2022   Lab Results  Component Value Date   ALT 25 01/19/2022   AST 17 01/19/2022   ALKPHOS 107 01/19/2022   BILITOT 0.3 01/19/2022   Lab Results  Component Value Date   HGBA1C 5.3 01/19/2022   HGBA1C 5.2 08/10/2021   HGBA1C 5.5 10/15/2020   HGBA1C 5.2 10/31/2018   Lab Results  Component Value Date   INSULIN 33.8 (H) 08/10/2021   INSULIN 29.3 (H) 10/15/2020   INSULIN 30.9 (H) 10/31/2018   Lab Results  Component Value Date   TSH 2.220 08/10/2021   Lab Results  Component Value Date   CHOL 165 08/10/2021   HDL 51 08/10/2021   LDLCALC 99 08/10/2021   TRIG 79 08/10/2021   Lab Results  Component Value Date   VD25OH 64.9 01/19/2022   VD25OH 91.7 08/10/2021   VD25OH 84.2 10/15/2020   Lab Results  Component Value Date   WBC 8.0 10/15/2020   HGB 15.4 10/15/2020  HCT 44.8 10/15/2020   MCV 95 10/15/2020   PLT 267 10/15/2020   No results found for: IRON, TIBC, FERRITIN  Attestation Statements:   Reviewed by clinician on day of visit: allergies, medications, problem list, medical history, surgical history, family history, social history, and previous encounter notes.  Time spent on visit including pre-visit chart review and post-visit care and charting was 32 minutes.    I, Trixie Dredge, am acting as transcriptionist  for Dennard Nip, MD.  I have reviewed the above documentation for accuracy and completeness, and I agree with the above. -  Dennard Nip, MD

## 2022-02-25 ENCOUNTER — Ambulatory Visit: Payer: 59 | Admitting: Plastic Surgery

## 2022-02-25 DIAGNOSIS — D169 Benign neoplasm of bone and articular cartilage, unspecified: Secondary | ICD-10-CM

## 2022-02-27 NOTE — Progress Notes (Signed)
   Referring Provider Heather Caraway, MD [place],  [place] [id number]   CC:  Multiple osteomas.  Heather Morales is an 61 y.o. female.  HPI: Patient is a 61 year old with multiple osteomas.  She is most bothered by the the 3 on her right forehead and the one on her left forehead.  Many of these are very small.  Review of Systems General: No fever or chills.  Physical Exam    02/08/2022    7:00 AM 01/19/2022    9:00 AM 12/27/2021    8:00 AM  Vitals with BMI  Height '5\' 6"'$  '5\' 6"'$  '5\' 6"'$   Weight 181 lbs 183 lbs 183 lbs  BMI 29.23 28.00 34.91  Systolic [phone]  Diastolic 79 82 77  Pulse 74 76 78    General:  No acute distress,  Alert and oriented, Non-Toxic, Normal speech and affect HEENT: Multiple osteomas.  There are 3 on her right forehead that I can palpate and 1 on the left forehead.  Assessment/Plan Discussed with the patient that these lesions are most likely benign but we can remove them and send to pathology to confirm this.  She would like to start with removal of the 4 lesions on her forehead.  We discussed that there will be a scar.  Lennice Sites 02/27/2022, 9:55 AM

## 2022-02-28 ENCOUNTER — Ambulatory Visit (INDEPENDENT_AMBULATORY_CARE_PROVIDER_SITE_OTHER): Payer: 59 | Admitting: Family Medicine

## 2022-02-28 ENCOUNTER — Other Ambulatory Visit (HOSPITAL_COMMUNITY): Payer: Self-pay

## 2022-02-28 ENCOUNTER — Encounter (INDEPENDENT_AMBULATORY_CARE_PROVIDER_SITE_OTHER): Payer: Self-pay | Admitting: Family Medicine

## 2022-02-28 VITALS — BP 122/79 | HR 70 | Temp 97.9°F | Ht 66.0 in | Wt 182.0 lb

## 2022-02-28 DIAGNOSIS — E8881 Metabolic syndrome: Secondary | ICD-10-CM | POA: Diagnosis not present

## 2022-02-28 DIAGNOSIS — E669 Obesity, unspecified: Secondary | ICD-10-CM | POA: Diagnosis not present

## 2022-02-28 DIAGNOSIS — Z7985 Long-term (current) use of injectable non-insulin antidiabetic drugs: Secondary | ICD-10-CM

## 2022-02-28 DIAGNOSIS — Z6829 Body mass index (BMI) 29.0-29.9, adult: Secondary | ICD-10-CM | POA: Diagnosis not present

## 2022-02-28 DIAGNOSIS — Z9189 Other specified personal risk factors, not elsewhere classified: Secondary | ICD-10-CM | POA: Insufficient documentation

## 2022-02-28 DIAGNOSIS — E559 Vitamin D deficiency, unspecified: Secondary | ICD-10-CM | POA: Diagnosis not present

## 2022-02-28 MED ORDER — TIRZEPATIDE 10 MG/0.5ML ~~LOC~~ SOAJ
10.0000 mg | SUBCUTANEOUS | 0 refills | Status: DC
  Filled 2022-02-28: qty 6, 84d supply, fill #0
  Filled 2022-03-14: qty 2, 28d supply, fill #0

## 2022-03-09 ENCOUNTER — Telehealth: Payer: Self-pay | Admitting: Plastic Surgery

## 2022-03-09 NOTE — Telephone Encounter (Signed)
LVM for call back to discuss surgery date.

## 2022-03-14 ENCOUNTER — Other Ambulatory Visit (HOSPITAL_COMMUNITY): Payer: Self-pay

## 2022-03-14 NOTE — Progress Notes (Signed)
Chief Complaint:   OBESITY Heather Morales is here to discuss her progress with her obesity treatment plan along with follow-up of her obesity related diagnoses. Heather Morales is on practicing portion control and making smarter food choices, such as increasing vegetables and decreasing simple carbohydrates and states she is following her eating plan approximately 20% of the time. Heather Morales states she is walking for 30 minutes 2 times per week.  Today's visit was #: 73 Starting weight: 225 lbs Starting date: 10/15/2020 Today's weight: 182 lbs Today's date: 02/28/2022 Total lbs lost to date: 43 Total lbs lost since last in-office visit: 0  Interim History: Heather Morales hasn't been able to concentrate on weight loss as much in the last 2-3 weeks. She is hoping to be able to portion control and work on increasing her protein.   Subjective:   1. Insulin resistance Heather Morales is doing well on her medications, and she is still using the coupon to get her Mounjaro.  2. Vitamin D deficiency Heather Morales's recent lab is stable and at goal on Vitamin D OTC.  3. At risk for impaired metabolic function Heather Morales is at increased risk for impaired metabolic function due to current nutrition and muscle mass.  Assessment/Plan:   1. Insulin resistance We will refill Mounjaro for 1 month. Heather Morales may need to change to [facility] if she can no longer get Mounjaro.   - tirzepatide (MOUNJARO) 10 MG/0.5ML Pen; Inject 10 mg into the skin once a week.  Dispense: 6 mL; Refill: 0  2. Vitamin D deficiency Heather Morales will continue Vitamin D OTC, and we will recheck labs in 2 months. She will follow-up for routine testing of Vitamin D, at least 2-3 times per year to avoid over-replacement.  3. At risk for impaired metabolic function Heather Morales was given approximately 15 minutes of impaired  metabolic function prevention counseling today. We discussed intensive lifestyle modifications today with an emphasis on specific nutrition and exercise instructions  and strategies.   Repetitive spaced learning was employed today to elicit superior memory formation and behavioral change.  4. Obesity, Current BMI 29.4 Heather Morales is currently in the action stage of change. As such, her goal is to continue with weight loss efforts. She has agreed to following a lower carbohydrate, vegetable and lean protein rich diet plan.   Exercise goals: As is.   Behavioral modification strategies: increasing lean protein intake.  Heather Morales has agreed to follow-up with our clinic in 4 weeks. She was informed of the importance of frequent follow-up visits to maximize her success with intensive lifestyle modifications for her multiple health conditions.   Objective:   Blood pressure 122/79, pulse 70, temperature 97.9 F (36.6 C), height '5\' 6"'$  (1.676 m), weight 182 lb (82.6 kg), SpO2 97 %. Body mass index is 29.38 kg/m.  General: Cooperative, alert, well developed, in no acute distress. HEENT: Conjunctivae and lids unremarkable. Cardiovascular: Regular rhythm.  Lungs: Normal work of breathing. Neurologic: No focal deficits.   Lab Results  Component Value Date   CREATININE 0.71 01/19/2022   BUN 24 01/19/2022   NA 142 01/19/2022   K 4.8 01/19/2022   CL 104 01/19/2022   CO2 16 (L) 01/19/2022   Lab Results  Component Value Date   ALT 25 01/19/2022   AST 17 01/19/2022   ALKPHOS 107 01/19/2022   BILITOT 0.3 01/19/2022   Lab Results  Component Value Date   HGBA1C 5.3 01/19/2022   HGBA1C 5.2 08/10/2021   HGBA1C 5.5 10/15/2020   HGBA1C 5.2 10/31/2018  Lab Results  Component Value Date   INSULIN 33.8 (H) 08/10/2021   INSULIN 29.3 (H) 10/15/2020   INSULIN 30.9 (H) 10/31/2018   Lab Results  Component Value Date   TSH 2.220 08/10/2021   Lab Results  Component Value Date   CHOL 165 08/10/2021   HDL 51 08/10/2021   LDLCALC 99 08/10/2021   TRIG 79 08/10/2021   Lab Results  Component Value Date   VD25OH 64.9 01/19/2022   VD25OH 91.7 08/10/2021   VD25OH  84.2 10/15/2020   Lab Results  Component Value Date   WBC 8.0 10/15/2020   HGB 15.4 10/15/2020   HCT 44.8 10/15/2020   MCV 95 10/15/2020   PLT 267 10/15/2020   No results found for: IRON, TIBC, FERRITIN  Attestation Statements:   Reviewed by clinician on day of visit: allergies, medications, problem list, medical history, surgical history, family history, social history, and previous encounter notes.   I, Trixie Dredge, am acting as transcriptionist for Dennard Nip, MD.  I have reviewed the above documentation for accuracy and completeness, and I agree with the above. -  Dennard Nip, MD

## 2022-03-21 ENCOUNTER — Other Ambulatory Visit (HOSPITAL_COMMUNITY): Payer: Self-pay

## 2022-03-21 ENCOUNTER — Ambulatory Visit (INDEPENDENT_AMBULATORY_CARE_PROVIDER_SITE_OTHER): Payer: 59 | Admitting: Family Medicine

## 2022-03-21 ENCOUNTER — Encounter (INDEPENDENT_AMBULATORY_CARE_PROVIDER_SITE_OTHER): Payer: Self-pay | Admitting: Family Medicine

## 2022-03-21 VITALS — BP 123/75 | HR 66 | Temp 97.7°F | Ht 66.0 in | Wt 178.0 lb

## 2022-03-21 DIAGNOSIS — Z6828 Body mass index (BMI) 28.0-28.9, adult: Secondary | ICD-10-CM

## 2022-03-21 DIAGNOSIS — E8881 Metabolic syndrome: Secondary | ICD-10-CM | POA: Diagnosis not present

## 2022-03-21 DIAGNOSIS — E669 Obesity, unspecified: Secondary | ICD-10-CM

## 2022-03-21 DIAGNOSIS — E559 Vitamin D deficiency, unspecified: Secondary | ICD-10-CM

## 2022-03-21 DIAGNOSIS — Z7985 Long-term (current) use of injectable non-insulin antidiabetic drugs: Secondary | ICD-10-CM

## 2022-03-21 DIAGNOSIS — Z9189 Other specified personal risk factors, not elsewhere classified: Secondary | ICD-10-CM

## 2022-03-21 MED ORDER — TIRZEPATIDE 10 MG/0.5ML ~~LOC~~ SOAJ
10.0000 mg | SUBCUTANEOUS | 0 refills | Status: DC
  Filled 2022-03-21: qty 6, 84d supply, fill #0
  Filled 2022-04-04: qty 2, 28d supply, fill #0

## 2022-03-22 NOTE — Progress Notes (Unsigned)
Chief Complaint:   OBESITY Heather Morales is here to discuss her progress with her obesity treatment plan along with follow-up of her obesity related diagnoses. Kenidee is on following a lower carbohydrate, vegetable and lean protein rich diet plan and states she is following her eating plan approximately 50% of the time. Jola states she is doing 0 minutes 0 times per week.  Today's visit was #: 31 Starting weight: 225 lbs Starting date: 10/15/2020 Today's weight: 178 lbs Today's date: 03/21/2022 Total lbs lost to date: 12 Total lbs lost since last in-office visit: 4  Interim History: Heather Morales continues to work on her weight loss.  Her hunger is low, but she may not be meeting her protein goals.  She is not exercising regularly.  Subjective:   1. Insulin resistance Heather Morales on medications and is doing well with decreasing simple carbohydrates.  She denies nausea or vomiting.  2. Vitamin D deficiency Heather Morales is on vitamin D OTC, and her last vitamin D level was at goal.  3. At risk for negative response to medication Heather Morales has allergies and mild intermittent asthma.  She is controlled and uses albuterol rarely.  She has not needed to take oral prednisone.  Assessment/Plan:   1. Insulin resistance We will refill Mounjaro for 1 month, and we will recheck labs in 6 weeks. Kellee will continue to work on weight loss, exercise, and decreasing simple carbohydrates to help decrease the risk of diabetes. Heather Morales agreed to follow-up with Heather Morales as directed to closely monitor her progress.  - tirzepatide (MOUNJARO) 10 MG/0.5ML Pen; Inject 10 mg into the skin once a week.  Dispense: 6 mL; Refill: 0  2. Vitamin D deficiency Heather Morales will continue OTC Vitamin D and we will recheck labs in 6 weeks. She will follow-up for routine testing of Vitamin D, at least 2-3 times per year to avoid over-replacement.  3. At risk for negative response to medication Heather Morales was educated on the need to control allergies  and/or asthma to avoid the need for prednisone in the future.  4. Obesity, Current BMI 28.9 Heather Morales is currently in the action stage of change. As such, her goal is to continue with weight loss efforts. She has agreed to keeping a food journal and adhering to recommended goals of 1200 calories and 80+ grams of protein daily.   We will recheck fasting labs at her next visit.  Exercise goals: Add strengthening exercises.   Behavioral modification strategies: increasing lean protein intake and meal planning and cooking strategies.  Heather Morales has agreed to follow-up with our clinic in 6 weeks. She was informed of the importance of frequent follow-up visits to maximize her success with intensive lifestyle modifications for her multiple health conditions.   Objective:   Blood pressure 123/75, pulse 66, temperature 97.7 F (36.5 C), height '5\' 6"'$  (1.676 m), weight 178 lb (80.7 kg), SpO2 98 %. Body mass index is 28.73 kg/m.  General: Cooperative, alert, well developed, in no acute distress. HEENT: Conjunctivae and lids unremarkable. Cardiovascular: Regular rhythm.  Lungs: Normal work of breathing. Neurologic: No focal deficits.   Lab Results  Component Value Date   CREATININE 0.71 01/19/2022   BUN 24 01/19/2022   NA 142 01/19/2022   K 4.8 01/19/2022   CL 104 01/19/2022   CO2 16 (L) 01/19/2022   Lab Results  Component Value Date   ALT 25 01/19/2022   AST 17 01/19/2022   ALKPHOS 107 01/19/2022   BILITOT 0.3 01/19/2022   Lab Results  Component Value Date   HGBA1C 5.3 01/19/2022   HGBA1C 5.2 08/10/2021   HGBA1C 5.5 10/15/2020   HGBA1C 5.2 10/31/2018   Lab Results  Component Value Date   INSULIN 33.8 (H) 08/10/2021   INSULIN 29.3 (H) 10/15/2020   INSULIN 30.9 (H) 10/31/2018   Lab Results  Component Value Date   TSH 2.220 08/10/2021   Lab Results  Component Value Date   CHOL 165 08/10/2021   HDL 51 08/10/2021   LDLCALC 99 08/10/2021   TRIG 79 08/10/2021   Lab Results   Component Value Date   VD25OH 64.9 01/19/2022   VD25OH 91.7 08/10/2021   VD25OH 84.2 10/15/2020   Lab Results  Component Value Date   WBC 8.0 10/15/2020   HGB 15.4 10/15/2020   HCT 44.8 10/15/2020   MCV 95 10/15/2020   PLT 267 10/15/2020   No results found for: "IRON", "TIBC", "FERRITIN"  Attestation Statements:   Reviewed by clinician on day of visit: allergies, medications, problem list, medical history, surgical history, family history, social history, and previous encounter notes.   I, Trixie Dredge, am acting as transcriptionist for Dennard Nip, MD.  I have reviewed the above documentation for accuracy and completeness, and I agree with the above. -  Dennard Nip, MD

## 2022-04-04 ENCOUNTER — Ambulatory Visit (INDEPENDENT_AMBULATORY_CARE_PROVIDER_SITE_OTHER): Payer: 59 | Admitting: Physician Assistant

## 2022-04-04 ENCOUNTER — Encounter (HOSPITAL_BASED_OUTPATIENT_CLINIC_OR_DEPARTMENT_OTHER): Payer: Self-pay | Admitting: Plastic Surgery

## 2022-04-04 ENCOUNTER — Other Ambulatory Visit: Payer: Self-pay

## 2022-04-04 ENCOUNTER — Other Ambulatory Visit (HOSPITAL_COMMUNITY): Payer: Self-pay

## 2022-04-04 VITALS — BP 133/82 | HR 75 | Ht 66.0 in | Wt 186.0 lb

## 2022-04-04 DIAGNOSIS — D169 Benign neoplasm of bone and articular cartilage, unspecified: Secondary | ICD-10-CM

## 2022-04-04 MED ORDER — ONDANSETRON 4 MG PO TBDP
4.0000 mg | ORAL_TABLET | Freq: Three times a day (TID) | ORAL | 0 refills | Status: DC | PRN
  Filled 2022-04-04: qty 20, 7d supply, fill #0

## 2022-04-04 MED ORDER — OXYCODONE HCL 5 MG PO TABS
5.0000 mg | ORAL_TABLET | Freq: Four times a day (QID) | ORAL | 0 refills | Status: AC | PRN
  Filled 2022-04-04: qty 20, 5d supply, fill #0

## 2022-04-04 NOTE — Progress Notes (Signed)
Patient ID: Heather Morales, female    DOB: 08-01-1961, 61 y.o.   MRN: [id number]  Chief Complaint  Patient presents with   Pre-op Exam      ICD-10-CM   1. Osteoma  D16.9        History of Present Illness: Heather Morales is a 61 y.o.  female  with a history of forehead osteomas.  She presents for preoperative evaluation for upcoming procedure, excision of multiple osteomas, scheduled for 04/20/2022 with Dr.  Erin Hearing .  The patient states that she has had significant PONV with previous surgeries.  Denies any personal history of CVA, MI, or use of blood thinners.  She had a skin cancer removed years ago, no other history of cancer.  She denies any personal or family history of blood clots or clotting disorder.  She is a non-smoker.  She will hold all of her vitamins and supplements 1 week prior to surgery.  She states that she does have asthma, but well controlled.  Summary of Previous Visit: Patient was seen for consult on 02/25/2022 by Dr. Erin Hearing.  She expressed that she was bothered by 3 osteomas on right side of forehead as well as 1 on left side of forehead.  Discussed surgical excision.  Patient would like to proceed.  She understands that there will be scars.  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  PMH Significant for: Forehead osteomas, GERD, anxiety.   Past Medical History: Allergies: Allergies  Allergen Reactions   Levaquin [Levofloxacin]    Penicillins     Current Medications:  Current Outpatient Medications:    albuterol (PROVENTIL HFA;VENTOLIN HFA) 108 (90 Base) MCG/ACT inhaler, Inhale into the lungs every 6 (six) hours as needed for wheezing or shortness of breath., Disp: , Rfl:    B Complex-C (B-COMPLEX WITH VITAMIN C) tablet, Take 1 tablet by mouth daily., Disp: , Rfl:    buPROPion (WELLBUTRIN XL) 300 MG 24 hr tablet, TAKE 1 TABLET BY MOUTH EVERY DAY IN THE MORNING, Disp: 30 tablet, Rfl: 0   diphenhydrAMINE HCl (BENADRYL ALLERGY  PO), Take 25 mg by mouth daily., Disp: , Rfl:    fluticasone (FLONASE) 50 MCG/ACT nasal spray, Place into both nostrils daily., Disp: , Rfl:    HYDROcodone-acetaminophen (NORCO/VICODIN) 5-325 MG tablet, Take 1 tablet by mouth every 4 (four) hours as needed., Disp: 20 tablet, Rfl: 0   ibuprofen (ADVIL) 400 MG tablet, Take 400 mg by mouth as needed., Disp: , Rfl:    Lansoprazole (PREVACID PO), Take 15 mg by mouth daily., Disp: , Rfl:    levocetirizine (XYZAL) 5 MG tablet, Take 5 mg by mouth every evening., Disp: , Rfl:    montelukast (SINGULAIR) 10 MG tablet, Take 10 mg by mouth at bedtime., Disp: , Rfl:    tirzepatide (MOUNJARO) 10 MG/0.5ML Pen, Inject 10 mg into the skin once a week., Disp: 6 mL, Rfl: 0   Vitamin D, Ergocalciferol, 2000 units CAPS, Take 5,000 Units by mouth every other day., Disp: , Rfl:   Past Medical Problems: Past Medical History:  Diagnosis Date   Anxiety    Asthma    Asthma in adult    Back pain    Borderline high blood pressure    Depression    Environmental allergies    Food allergy    GERD (gastroesophageal reflux disease)    High blood pressure    Other fatigue    Prediabetes    Seasonal allergies  Shortness of breath on exertion    Shoulder injury    Sleep apnea    SOB (shortness of breath)    Swelling     Past Surgical History: Past Surgical History:  Procedure Laterality Date   CERVICAL CONIZATION W/BX  2000   ENDOMETRIAL ABLATION     eyelid surgery     FOOT SURGERY      Social History: Social History   Socioeconomic History   Marital status: Married    Spouse name: Public librarian   Number of children: Not on file   Years of education: Not on file   Highest education level: Not on file  Occupational History   Occupation: Health and safety inspector  Tobacco Use   Smoking status: Former   Smokeless tobacco: Never  Scientific laboratory technician Use: Never used  Substance and Sexual Activity   Alcohol use: Yes   Drug use: Never    Sexual activity: Yes    Partners: Male  Other Topics Concern   Not on file  Social History Narrative   Not on file   Social Determinants of Health   Financial Resource Strain: Not on file  Food Insecurity: Not on file  Transportation Needs: Not on file  Physical Activity: Not on file  Stress: Not on file  Social Connections: Not on file  Intimate Partner Violence: Not on file    Family History: Family History  Problem Relation Age of Onset   Hypertension Mother    Hyperlipidemia Mother    Depression Mother    Anxiety disorder Mother    Obesity Father    Eating disorder Father    Sleep apnea Father    Anxiety disorder Father     Review of Systems: ROS Denies any recent chest pain, difficulty breathing, leg swelling, or fevers.  Physical Exam: Vital Signs BP 133/82 (BP Location: Left Arm, Patient Position: Sitting, Cuff Size: Small)   Pulse 75   Ht '5\' 6"'$  (1.676 m)   Wt 186 lb (84.4 kg)   SpO2 98%   BMI 30.02 kg/m   Physical Exam Constitutional:      General: Not in acute distress.    Appearance: Normal appearance. Not ill-appearing.  HENT:     Head: Normocephalic and atraumatic.  Eyes:     Pupils: Pupils are equal, round. Cardiovascular:     Rate and Rhythm: Normal rate.    Pulses: Normal pulses.  Pulmonary:     Effort: No respiratory distress or increased work of breathing.  Speaks in full sentences. Abdominal:     General: Abdomen is flat. No distension.   Musculoskeletal: Normal range of motion. No lower extremity swelling or edema. No varicosities. Skin:    General: Skin is warm and dry.     Findings: No erythema or rash.  Neurological:     Mental Status: Alert and oriented to person, place, and time.  Psychiatric:        Mood and Affect: Mood normal.        Behavior: Behavior normal.    Assessment/Plan: The patient is scheduled for excision of multiple forehead osteomas with Dr. Erin Hearing.  Risks, benefits, and alternatives of procedure  discussed, questions answered and consent obtained.    Smoking Status: Non-smoker.  Caprini Score: 5; Risk Factors include: Age, BMI greater than 25, and length of planned surgery. Recommendation for mechanical prophylaxis. Encourage early ambulation.   Pictures obtained: Today  Post-op Rx sent to pharmacy: Oxycodone and Zofran  Patient was  provided with the General Surgical Risk consent document and Pain Medication Agreement prior to their appointment.  They had adequate time to read through the risk consent documents and Pain Medication Agreement. We also discussed them in person together during this preop appointment. All of their questions were answered to their satisfaction.  Recommended calling if they have any further questions.  Risk consent form and Pain Medication Agreement to be scanned into patient's chart.  Discussed anticipated scarring and bruising.  Electronically signed by: Krista Blue, PA-C 04/04/2022 9:19 AM

## 2022-04-04 NOTE — H&P (View-Only) (Signed)
Patient ID: Heather Morales, female    DOB: [birth year], 61 y.o.   MRN: [id number]  Chief Complaint  Patient presents with   Pre-op Exam      ICD-10-CM   1. Osteoma  D16.9        History of Present Illness: Heather Morales is a 61 y.o.  female  with a history of forehead osteomas.  She presents for preoperative evaluation for upcoming procedure, excision of multiple osteomas, scheduled for 04/20/2022 with Dr.  Erin Hearing .  The patient states that she has had significant PONV with previous surgeries.  Denies any personal history of CVA, MI, or use of blood thinners.  She had a skin cancer removed years ago, no other history of cancer.  She denies any personal or family history of blood clots or clotting disorder.  She is a non-smoker.  She will hold all of her vitamins and supplements 1 week prior to surgery.  She states that she does have asthma, but well controlled.  Summary of Previous Visit: Patient was seen for consult on 02/25/2022 by Dr. Erin Hearing.  She expressed that she was bothered by 3 osteomas on right side of forehead as well as 1 on left side of forehead.  Discussed surgical excision.  Patient would like to proceed.  She understands that there will be scars.  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  PMH Significant for: Forehead osteomas, GERD, anxiety.   Past Medical History: Allergies: Allergies  Allergen Reactions   Levaquin [Levofloxacin]    Penicillins     Current Medications:  Current Outpatient Medications:    albuterol (PROVENTIL HFA;VENTOLIN HFA) 108 (90 Base) MCG/ACT inhaler, Inhale into the lungs every 6 (six) hours as needed for wheezing or shortness of breath., Disp: , Rfl:    B Complex-C (B-COMPLEX WITH VITAMIN C) tablet, Take 1 tablet by mouth daily., Disp: , Rfl:    buPROPion (WELLBUTRIN XL) 300 MG 24 hr tablet, TAKE 1 TABLET BY MOUTH EVERY DAY IN THE MORNING, Disp: 30 tablet, Rfl: 0   diphenhydrAMINE HCl (BENADRYL ALLERGY  PO), Take 25 mg by mouth daily., Disp: , Rfl:    fluticasone (FLONASE) 50 MCG/ACT nasal spray, Place into both nostrils daily., Disp: , Rfl:    HYDROcodone-acetaminophen (NORCO/VICODIN) 5-325 MG tablet, Take 1 tablet by mouth every 4 (four) hours as needed., Disp: 20 tablet, Rfl: 0   ibuprofen (ADVIL) 400 MG tablet, Take 400 mg by mouth as needed., Disp: , Rfl:    Lansoprazole (PREVACID PO), Take 15 mg by mouth daily., Disp: , Rfl:    levocetirizine (XYZAL) 5 MG tablet, Take 5 mg by mouth every evening., Disp: , Rfl:    montelukast (SINGULAIR) 10 MG tablet, Take 10 mg by mouth at bedtime., Disp: , Rfl:    tirzepatide (MOUNJARO) 10 MG/0.5ML Pen, Inject 10 mg into the skin once a week., Disp: 6 mL, Rfl: 0   Vitamin D, Ergocalciferol, 2000 units CAPS, Take 5,000 Units by mouth every other day., Disp: , Rfl:   Past Medical Problems: Past Medical History:  Diagnosis Date   Anxiety    Asthma    Asthma in adult    Back pain    Borderline high blood pressure    Depression    Environmental allergies    Food allergy    GERD (gastroesophageal reflux disease)    High blood pressure    Other fatigue    Prediabetes    Seasonal allergies  Shortness of breath on exertion    Shoulder injury    Sleep apnea    SOB (shortness of breath)    Swelling     Past Surgical History: Past Surgical History:  Procedure Laterality Date   CERVICAL CONIZATION W/BX  2000   ENDOMETRIAL ABLATION     eyelid surgery     FOOT SURGERY      Social History: Social History   Socioeconomic History   Marital status: Married    Spouse name: Public librarian   Number of children: Not on file   Years of education: Not on file   Highest education level: Not on file  Occupational History   Occupation: Health and safety inspector  Tobacco Use   Smoking status: Former   Smokeless tobacco: Never  Scientific laboratory technician Use: Never used  Substance and Sexual Activity   Alcohol use: Yes   Drug use: Never    Sexual activity: Yes    Partners: Male  Other Topics Concern   Not on file  Social History Narrative   Not on file   Social Determinants of Health   Financial Resource Strain: Not on file  Food Insecurity: Not on file  Transportation Needs: Not on file  Physical Activity: Not on file  Stress: Not on file  Social Connections: Not on file  Intimate Partner Violence: Not on file    Family History: Family History  Problem Relation Age of Onset   Hypertension Mother    Hyperlipidemia Mother    Depression Mother    Anxiety disorder Mother    Obesity Father    Eating disorder Father    Sleep apnea Father    Anxiety disorder Father     Review of Systems: ROS Denies any recent chest pain, difficulty breathing, leg swelling, or fevers.  Physical Exam: Vital Signs BP 133/82 (BP Location: Left Arm, Patient Position: Sitting, Cuff Size: Small)   Pulse 75   Ht '5\' 6"'$  (1.676 m)   Wt 186 lb (84.4 kg)   SpO2 98%   BMI 30.02 kg/m   Physical Exam Constitutional:      General: Not in acute distress.    Appearance: Normal appearance. Not ill-appearing.  HENT:     Head: Normocephalic and atraumatic.  Eyes:     Pupils: Pupils are equal, round. Cardiovascular:     Rate and Rhythm: Normal rate.    Pulses: Normal pulses.  Pulmonary:     Effort: No respiratory distress or increased work of breathing.  Speaks in full sentences. Abdominal:     General: Abdomen is flat. No distension.   Musculoskeletal: Normal range of motion. No lower extremity swelling or edema. No varicosities. Skin:    General: Skin is warm and dry.     Findings: No erythema or rash.  Neurological:     Mental Status: Alert and oriented to person, place, and time.  Psychiatric:        Mood and Affect: Mood normal.        Behavior: Behavior normal.    Assessment/Plan: The patient is scheduled for excision of multiple forehead osteomas with Dr. Erin Hearing.  Risks, benefits, and alternatives of procedure  discussed, questions answered and consent obtained.    Smoking Status: Non-smoker.  Caprini Score: 5; Risk Factors include: Age, BMI greater than 25, and length of planned surgery. Recommendation for mechanical prophylaxis. Encourage early ambulation.   Pictures obtained: Today  Post-op Rx sent to pharmacy: Oxycodone and Zofran  Patient was  provided with the General Surgical Risk consent document and Pain Medication Agreement prior to their appointment.  They had adequate time to read through the risk consent documents and Pain Medication Agreement. We also discussed them in person together during this preop appointment. All of their questions were answered to their satisfaction.  Recommended calling if they have any further questions.  Risk consent form and Pain Medication Agreement to be scanned into patient's chart.  Discussed anticipated scarring and bruising.  Electronically signed by: Krista Blue, PA-C 04/04/2022 9:19 AM

## 2022-04-06 ENCOUNTER — Other Ambulatory Visit (HOSPITAL_COMMUNITY): Payer: Self-pay

## 2022-04-07 ENCOUNTER — Encounter (HOSPITAL_BASED_OUTPATIENT_CLINIC_OR_DEPARTMENT_OTHER): Payer: Self-pay | Admitting: Plastic Surgery

## 2022-04-07 ENCOUNTER — Other Ambulatory Visit (HOSPITAL_COMMUNITY): Payer: Self-pay

## 2022-04-07 NOTE — Progress Notes (Signed)
Spoke w/ via phone for pre-op interview--- pt Lab needs dos----  no             Lab results------ no COVID test -----patient states asymptomatic no test needed Arrive at ------- 0730 on 04-14-2022 NPO after MN NO Solid Food.  Clear liquids from MN until--- [phone] Med rec completed Medications to take morning of surgery ----- prevacid, wellbutrin, xyzal, benadryl allergy, singulair, flonase spray Diabetic medication ----- n/a Patient instructed no nail polish to be worn day of surgery Patient instructed to bring photo id and insurance card day of surgery Patient aware to have Driver (ride ) / caregiver for 24 hours after surgery -- husband, Heather Morales Patient Special Instructions ----- asked to bring rescue inhaler dos Pre-Op special Istructions ----- sent inbox message to dr luppens in epic, requested orders Patient verbalized understanding of instructions that were given at this phone interview. Patient denies shortness of breath, chest pain, fever, cough at this phone interview.

## 2022-04-11 ENCOUNTER — Other Ambulatory Visit (HOSPITAL_COMMUNITY): Payer: Self-pay

## 2022-04-13 ENCOUNTER — Other Ambulatory Visit (HOSPITAL_COMMUNITY): Payer: Self-pay

## 2022-04-14 DIAGNOSIS — D169 Benign neoplasm of bone and articular cartilage, unspecified: Secondary | ICD-10-CM

## 2022-04-18 ENCOUNTER — Ambulatory Visit (INDEPENDENT_AMBULATORY_CARE_PROVIDER_SITE_OTHER): Payer: 59 | Admitting: Family Medicine

## 2022-04-19 ENCOUNTER — Encounter (HOSPITAL_BASED_OUTPATIENT_CLINIC_OR_DEPARTMENT_OTHER)
Admission: RE | Disposition: A | Discharge status: Home / Self Care | Payer: Self-pay | Source: Home / Self Care | Attending: Plastic Surgery

## 2022-04-19 ENCOUNTER — Other Ambulatory Visit: Payer: Self-pay

## 2022-04-19 ENCOUNTER — Ambulatory Visit (HOSPITAL_BASED_OUTPATIENT_CLINIC_OR_DEPARTMENT_OTHER)
Admission: RE | Admit: 2022-04-19 | Discharge: 2022-04-19 | Disposition: A | Discharge status: Home / Self Care | Payer: 59 | Attending: Plastic Surgery | Admitting: Plastic Surgery

## 2022-04-19 ENCOUNTER — Ambulatory Visit (HOSPITAL_BASED_OUTPATIENT_CLINIC_OR_DEPARTMENT_OTHER): Payer: 59 | Admitting: Certified Registered"

## 2022-04-19 ENCOUNTER — Encounter (HOSPITAL_BASED_OUTPATIENT_CLINIC_OR_DEPARTMENT_OTHER): Payer: Self-pay | Admitting: Plastic Surgery

## 2022-04-19 DIAGNOSIS — D169 Benign neoplasm of bone and articular cartilage, unspecified: Secondary | ICD-10-CM | POA: Diagnosis not present

## 2022-04-19 DIAGNOSIS — J45909 Unspecified asthma, uncomplicated: Secondary | ICD-10-CM | POA: Insufficient documentation

## 2022-04-19 DIAGNOSIS — F32A Depression, unspecified: Secondary | ICD-10-CM | POA: Insufficient documentation

## 2022-04-19 DIAGNOSIS — D164 Benign neoplasm of bones of skull and face: Secondary | ICD-10-CM

## 2022-04-19 DIAGNOSIS — K219 Gastro-esophageal reflux disease without esophagitis: Secondary | ICD-10-CM | POA: Insufficient documentation

## 2022-04-19 HISTORY — DX: Allergic rhinitis, unspecified: J30.9

## 2022-04-19 HISTORY — DX: Migraine, unspecified, not intractable, without status migrainosus: G43.909

## 2022-04-19 HISTORY — PX: EXCISION MASS HEAD: SHX6702

## 2022-04-19 HISTORY — DX: Major depressive disorder, single episode, unspecified: F32.9

## 2022-04-19 HISTORY — DX: Elevated blood-pressure reading, without diagnosis of hypertension: R03.0

## 2022-04-19 HISTORY — DX: Prediabetes: R73.03

## 2022-04-19 HISTORY — DX: Benign neoplasm of bones of skull and face: D16.4

## 2022-04-19 HISTORY — DX: Nausea with vomiting, unspecified: Z98.890

## 2022-04-19 HISTORY — DX: Personal history of adenomatous and serrated colon polyps: Z86.0101

## 2022-04-19 HISTORY — DX: Personal history of other malignant neoplasm of skin: Z85.828

## 2022-04-19 HISTORY — DX: Personal history of colonic polyps: Z86.010

## 2022-04-19 HISTORY — DX: Nausea with vomiting, unspecified: R11.2

## 2022-04-19 HISTORY — DX: Other allergic rhinitis: J30.89

## 2022-04-19 SURGERY — EXCISION, MASS, HEAD
Anesthesia: General | Site: Face | Laterality: Bilateral

## 2022-04-19 MED ORDER — CHLORHEXIDINE GLUCONATE CLOTH 2 % EX PADS: 6.0000 | MEDICATED_PAD | Freq: Once | CUTANEOUS | Status: DC

## 2022-04-19 MED ORDER — BACITRACIN ZINC 500 UNIT/GM EX OINT
TOPICAL_OINTMENT | CUTANEOUS | Status: AC
  Filled 2022-04-19: qty 28.35

## 2022-04-19 MED ORDER — MIDAZOLAM HCL 2 MG/2ML IJ SOLN
INTRAMUSCULAR | Status: AC
  Filled 2022-04-19: qty 2

## 2022-04-19 MED ORDER — FENTANYL CITRATE (PF) 100 MCG/2ML IJ SOLN
INTRAMUSCULAR | Status: DC | PRN
  Administered 2022-04-19: 50 ug via INTRAVENOUS

## 2022-04-19 MED ORDER — CEFAZOLIN SODIUM-DEXTROSE 2-4 GM/100ML-% IV SOLN
INTRAVENOUS | Status: AC
  Filled 2022-04-19: qty 100

## 2022-04-19 MED ORDER — OXYCODONE HCL 5 MG PO TABS: 5.0000 mg | ORAL_TABLET | Freq: Once | ORAL | Status: DC | PRN

## 2022-04-19 MED ORDER — ONDANSETRON HCL 4 MG/2ML IJ SOLN
INTRAMUSCULAR | Status: AC
  Filled 2022-04-19: qty 16

## 2022-04-19 MED ORDER — ONDANSETRON HCL 4 MG/2ML IJ SOLN: 4.0000 mg | Freq: Once | INTRAMUSCULAR | Status: DC | PRN

## 2022-04-19 MED ORDER — PROPOFOL 500 MG/50ML IV EMUL
INTRAVENOUS | Status: AC
Start: 2022-04-19 — End: ?
  Filled 2022-04-19: qty 50

## 2022-04-19 MED ORDER — SCOPOLAMINE 1 MG/3DAYS TD PT72
MEDICATED_PATCH | TRANSDERMAL | Status: AC
  Filled 2022-04-19: qty 1

## 2022-04-19 MED ORDER — BACITRACIN 500 UNIT/GM EX OINT
TOPICAL_OINTMENT | CUTANEOUS | Status: DC | PRN
  Administered 2022-04-19: 1 via TOPICAL

## 2022-04-19 MED ORDER — MIDAZOLAM HCL 5 MG/5ML IJ SOLN
INTRAMUSCULAR | Status: DC | PRN
  Administered 2022-04-19: 2 mg via INTRAVENOUS

## 2022-04-19 MED ORDER — PROPOFOL 10 MG/ML IV BOLUS
INTRAVENOUS | Status: DC | PRN
  Administered 2022-04-19: 200 mg via INTRAVENOUS

## 2022-04-19 MED ORDER — DEXMEDETOMIDINE HCL IN NACL 80 MCG/20ML IV SOLN
INTRAVENOUS | Status: AC
  Filled 2022-04-19: qty 20

## 2022-04-19 MED ORDER — DEXAMETHASONE SODIUM PHOSPHATE 10 MG/ML IJ SOLN
INTRAMUSCULAR | Status: AC
Start: 2022-04-19 — End: ?
  Filled 2022-04-19: qty 2

## 2022-04-19 MED ORDER — PHENYLEPHRINE 80 MCG/ML (10ML) SYRINGE FOR IV PUSH (FOR BLOOD PRESSURE SUPPORT)
PREFILLED_SYRINGE | INTRAVENOUS | Status: AC
Start: 2022-04-19 — End: ?
  Filled 2022-04-19: qty 10

## 2022-04-19 MED ORDER — ACETAMINOPHEN 325 MG PO TABS: 325.0000 mg | ORAL_TABLET | ORAL | Status: DC | PRN

## 2022-04-19 MED ORDER — FENTANYL CITRATE (PF) 100 MCG/2ML IJ SOLN
INTRAMUSCULAR | Status: AC
  Filled 2022-04-19: qty 2

## 2022-04-19 MED ORDER — LIDOCAINE 2% (20 MG/ML) 5 ML SYRINGE
INTRAMUSCULAR | Status: DC | PRN
  Administered 2022-04-19: 60 mg via INTRAVENOUS

## 2022-04-19 MED ORDER — CEFAZOLIN SODIUM-DEXTROSE 2-4 GM/100ML-% IV SOLN
2.0000 g | INTRAVENOUS | Status: AC
  Administered 2022-04-19: 2 g via INTRAVENOUS

## 2022-04-19 MED ORDER — ACETAMINOPHEN 160 MG/5ML PO SOLN: 325.0000 mg | ORAL | Status: DC | PRN

## 2022-04-19 MED ORDER — FENTANYL CITRATE (PF) 100 MCG/2ML IJ SOLN: 25.0000 ug | INTRAMUSCULAR | Status: DC | PRN

## 2022-04-19 MED ORDER — SCOPOLAMINE 1 MG/3DAYS TD PT72
MEDICATED_PATCH | TRANSDERMAL | Status: DC | PRN
  Administered 2022-04-19: 1 via TRANSDERMAL

## 2022-04-19 MED ORDER — ONDANSETRON HCL 4 MG/2ML IJ SOLN
INTRAMUSCULAR | Status: DC | PRN
  Administered 2022-04-19: 4 mg via INTRAVENOUS

## 2022-04-19 MED ORDER — OXYCODONE HCL 5 MG/5ML PO SOLN: 5.0000 mg | Freq: Once | ORAL | Status: DC | PRN

## 2022-04-19 MED ORDER — 0.9 % SODIUM CHLORIDE (POUR BTL) OPTIME
TOPICAL | Status: DC | PRN
  Administered 2022-04-19: 120 mL

## 2022-04-19 MED ORDER — BUPIVACAINE-EPINEPHRINE 0.25% -1:200000 IJ SOLN
INTRAMUSCULAR | Status: DC | PRN
  Administered 2022-04-19: 9 mL

## 2022-04-19 MED ORDER — MEPERIDINE HCL 25 MG/ML IJ SOLN: 6.2500 mg | INTRAMUSCULAR | Status: DC | PRN

## 2022-04-19 MED ORDER — KETOROLAC TROMETHAMINE 30 MG/ML IJ SOLN: 30.0000 mg | Freq: Once | INTRAMUSCULAR | Status: DC | PRN

## 2022-04-19 MED ORDER — DEXAMETHASONE SODIUM PHOSPHATE 4 MG/ML IJ SOLN
INTRAMUSCULAR | Status: DC | PRN
  Administered 2022-04-19: 4 mg via INTRAVENOUS

## 2022-04-19 MED ORDER — PROPOFOL 500 MG/50ML IV EMUL
INTRAVENOUS | Status: DC | PRN
  Administered 2022-04-19: 25 ug/kg/min via INTRAVENOUS

## 2022-04-19 MED ORDER — EPHEDRINE 5 MG/ML INJ
INTRAVENOUS | Status: AC
Start: 2022-04-19 — End: ?
  Filled 2022-04-19: qty 5

## 2022-04-19 MED ORDER — LIDOCAINE 2% (20 MG/ML) 5 ML SYRINGE
INTRAMUSCULAR | Status: AC
  Filled 2022-04-19: qty 25

## 2022-04-19 MED ORDER — PHENYLEPHRINE HCL (PRESSORS) 10 MG/ML IV SOLN
INTRAVENOUS | Status: DC | PRN
  Administered 2022-04-19: 80 ug via INTRAVENOUS
  Administered 2022-04-19: 40 ug via INTRAVENOUS

## 2022-04-19 MED ORDER — PHENYLEPHRINE HCL (PRESSORS) 10 MG/ML IV SOLN
INTRAVENOUS | Status: AC
Start: 2022-04-19 — End: ?
  Filled 2022-04-19: qty 1

## 2022-04-19 MED ORDER — LACTATED RINGERS IV SOLN: INTRAVENOUS | Status: DC

## 2022-04-19 SURGICAL SUPPLY — 82 items
ADH SKN CLS APL DERMABOND .7 (GAUZE/BANDAGES/DRESSINGS)
APL SKNCLS STERI-STRIP NONHPOA (GAUZE/BANDAGES/DRESSINGS)
BAND INSRT 18 STRL LF DISP RB (MISCELLANEOUS)
BAND RUBBER #18 3X1/16 STRL (MISCELLANEOUS) IMPLANT
BENZOIN TINCTURE PRP APPL 2/3 (GAUZE/BANDAGES/DRESSINGS) IMPLANT
BLADE CLIPPER SURG (BLADE) IMPLANT
BLADE SURG 11 STRL SS (BLADE) IMPLANT
BLADE SURG 15 STRL LF DISP TIS (BLADE) ×2 IMPLANT
BLADE SURG 15 STRL SS (BLADE) ×2
BNDG CMPR 75X21 PLY HI ABS (MISCELLANEOUS)
BNDG ELASTIC 2X5.8 VLCR STR LF (GAUZE/BANDAGES/DRESSINGS) IMPLANT
BNDG GAUZE DERMACEA FLUFF (GAUZE/BANDAGES/DRESSINGS)
BNDG GAUZE DERMACEA FLUFF 4 (GAUZE/BANDAGES/DRESSINGS) IMPLANT
BNDG GZE DERMACEA 4 6PLY (GAUZE/BANDAGES/DRESSINGS)
BUR EGG 3PK/BX (BURR) IMPLANT
BUR PEAR (BURR) IMPLANT
CANISTER SUCT 1200ML W/VALVE (MISCELLANEOUS) ×1 IMPLANT
CLEANER CAUTERY TIP 5X5 PAD (MISCELLANEOUS) IMPLANT
CORD BIPOLAR FORCEPS 12FT (ELECTRODE) ×1 IMPLANT
COVER BACK TABLE 60X90IN (DRAPES) ×3 IMPLANT
COVER MAYO STAND STRL (DRAPES) ×3 IMPLANT
DERMABOND ADVANCED (GAUZE/BANDAGES/DRESSINGS)
DERMABOND ADVANCED .7 DNX12 (GAUZE/BANDAGES/DRESSINGS) IMPLANT
DRAPE U-SHAPE 76X120 STRL (DRAPES) ×3 IMPLANT
DRESSING MEPILEX FLEX 4X4 (GAUZE/BANDAGES/DRESSINGS) IMPLANT
DRSG MEPILEX FLEX 4X4 (GAUZE/BANDAGES/DRESSINGS)
DRSG TEGADERM 2-3/8X2-3/4 SM (GAUZE/BANDAGES/DRESSINGS) IMPLANT
DRSG TEGADERM 4X4.75 (GAUZE/BANDAGES/DRESSINGS) IMPLANT
ELECT COATED BLADE 2.86 ST (ELECTRODE) IMPLANT
ELECT NDL BLADE 2-5/6 (NEEDLE) IMPLANT
ELECT NEEDLE BLADE 2-5/6 (NEEDLE) IMPLANT
ELECT REM PT RETURN 9FT ADLT (ELECTROSURGICAL) ×2
ELECT REM PT RETURN 9FT PED (ELECTROSURGICAL)
ELECTRODE REM PT RETRN 9FT PED (ELECTROSURGICAL) IMPLANT
ELECTRODE REM PT RTRN 9FT ADLT (ELECTROSURGICAL) IMPLANT
GAUZE SPONGE 4X4 12PLY STRL LF (GAUZE/BANDAGES/DRESSINGS) IMPLANT
GAUZE STRETCH 2X75IN STRL (MISCELLANEOUS) IMPLANT
GLOVE BIOGEL M STRL SZ7.5 (GLOVE) ×1 IMPLANT
GLOVE BIOGEL PI IND STRL 7.0 (GLOVE) IMPLANT
GLOVE BIOGEL PI IND STRL 7.5 (GLOVE) ×2 IMPLANT
GLOVE BIOGEL PI INDICATOR 7.0 (GLOVE) ×2
GLOVE BIOGEL PI INDICATOR 7.5 (GLOVE) ×3
GLOVE SURG SS PI 7.0 STRL IVOR (GLOVE) ×1 IMPLANT
GLOVE SURG SS PI 7.5 STRL IVOR (GLOVE) ×4 IMPLANT
GOWN STRL REUS W/ TWL LRG LVL3 (GOWN DISPOSABLE) IMPLANT
GOWN STRL REUS W/ TWL XL LVL3 (GOWN DISPOSABLE) ×2 IMPLANT
GOWN STRL REUS W/TWL LRG LVL3 (GOWN DISPOSABLE) ×2
GOWN STRL REUS W/TWL XL LVL3 (GOWN DISPOSABLE) ×6
NDL HYPO 27GX1-1/4 (NEEDLE) ×2 IMPLANT
NDL HYPO 30GX1 BEV (NEEDLE) IMPLANT
NEEDLE HYPO 27GX1-1/4 (NEEDLE) ×2 IMPLANT
NEEDLE HYPO 30GX1 BEV (NEEDLE) IMPLANT
NS IRRIG 1000ML POUR BTL (IV SOLUTION) ×1 IMPLANT
PACK BASIN DAY SURGERY FS (CUSTOM PROCEDURE TRAY) ×3 IMPLANT
PAD CLEANER CAUTERY TIP 5X5 (MISCELLANEOUS)
PENCIL SMOKE EVACUATOR (MISCELLANEOUS) ×3 IMPLANT
SHEET MEDIUM DRAPE 40X70 STRL (DRAPES) IMPLANT
SLEEVE SCD COMPRESS KNEE MED (STOCKING) ×1 IMPLANT
SPIKE FLUID TRANSFER (MISCELLANEOUS) IMPLANT
SPONGE GAUZE 2X2 8PLY STRL LF (GAUZE/BANDAGES/DRESSINGS) IMPLANT
STRIP CLOSURE SKIN 1/2X4 (GAUZE/BANDAGES/DRESSINGS) IMPLANT
SUCTION FRAZIER HANDLE 10FR (MISCELLANEOUS) ×2
SUCTION TUBE FRAZIER 10FR DISP (MISCELLANEOUS) IMPLANT
SUT BONE WAX W31G (SUTURE) IMPLANT
SUT MNCRL 6-0 UNDY P1 1X18 (SUTURE) IMPLANT
SUT MNCRL AB 3-0 PS2 18 (SUTURE) IMPLANT
SUT MNCRL AB 4-0 PS2 18 (SUTURE) IMPLANT
SUT MON AB 5-0 P3 18 (SUTURE) IMPLANT
SUT MON AB 5-0 PS2 18 (SUTURE) ×1 IMPLANT
SUT MONOCRYL 6-0 P1 1X18 (SUTURE)
SUT PROLENE 5 0 P 3 (SUTURE) ×1 IMPLANT
SUT PROLENE 5 0 PS 2 (SUTURE) ×1 IMPLANT
SUT PROLENE 6 0 P 1 18 (SUTURE) IMPLANT
SUT VIC AB 5-0 P-3 18X BRD (SUTURE) IMPLANT
SUT VIC AB 5-0 P3 18 (SUTURE)
SUT VIC AB 5-0 PS2 18 (SUTURE) IMPLANT
SUT VICRYL 4-0 PS2 18IN ABS (SUTURE) IMPLANT
SYR BULB EAR ULCER 3OZ GRN STR (SYRINGE) ×1 IMPLANT
SYR CONTROL 10ML LL (SYRINGE) ×3 IMPLANT
TOWEL GREEN STERILE FF (TOWEL DISPOSABLE) ×3 IMPLANT
TRAY DSU PREP LF (CUSTOM PROCEDURE TRAY) ×3 IMPLANT
TUBE CONNECTING 20X1/4 (TUBING) ×1 IMPLANT

## 2022-04-19 NOTE — Anesthesia Procedure Notes (Signed)
Procedure Name: LMA Insertion Date/Time: 04/19/2022 8:48 AM  Performed by: Kinisha Soper, Ernesta Amble, CRNAPre-anesthesia Checklist: Patient identified, Emergency Drugs available, Suction available and Patient being monitored Patient Re-evaluated:Patient Re-evaluated prior to induction Oxygen Delivery Method: Circle system utilized Preoxygenation: Pre-oxygenation with 100% oxygen Induction Type: IV induction Ventilation: Mask ventilation without difficulty LMA: LMA inserted LMA Size: 4.0 Number of attempts: 1 Airway Equipment and Method: Bite block Placement Confirmation: positive ETCO2 Tube secured with: Tape Dental Injury: Teeth and Oropharynx as per pre-operative assessment

## 2022-04-19 NOTE — Interval H&P Note (Signed)
History and Physical Interval Note:  04/19/2022 10:05 AM  Heather Morales  has presented today for surgery, with the diagnosis of Osteomas of Forehead.  The various methods of treatment have been discussed with the patient and family. After consideration of risks, benefits and other options for treatment, the patient has consented to  Procedure(s) with comments: EXCISION OSTEOMAS OF FOREHEAD: RIGHT MIDDLE FOREHEAD - 7 MM; RIGHT INFERIOR FOREHEAD - 5 MM; RIGHT FRONTAL SUPERIOR FOREHEAD AT HAIRLINE; LEFT FRONTAL AT HAIRLINE (Bilateral) - 2.5 CM; 1.5 CM. 1.5 CM as a surgical intervention.  The patient's history has been reviewed, patient examined, no change in status, stable for surgery.  I have reviewed the patient's chart and labs.  Questions were answered to the patient's satisfaction.     Lennice Sites

## 2022-04-19 NOTE — Transfer of Care (Signed)
Immediate Anesthesia Transfer of Care Note  Patient: Heather Morales  Procedure(s) Performed: EXCISION OSTEOMAS OF FOREHEAD: RIGHT MIDDLE FOREHEAD - 7 MM; RIGHT INFERIOR FOREHEAD - 5 MM; RIGHT FRONTAL SUPERIOR FOREHEAD AT HAIRLINE; LEFT FRONTAL AT HAIRLINE (Bilateral: Face)  Patient Location: PACU  Anesthesia Type:General  Level of Consciousness: drowsy and patient cooperative  Airway & Oxygen Therapy: Patient Spontanous Breathing and Patient connected to face mask oxygen  Post-op Assessment: Report given to RN and Post -op Vital signs reviewed and stable  Post vital signs: Reviewed and stable  Last Vitals:  Vitals Value Taken Time  BP    Temp    Pulse 79 04/19/22 0949  Resp 21 04/19/22 0949  SpO2 97 % 04/19/22 0949  Vitals shown include unvalidated device data.  Last Pain:  Vitals:   04/19/22 0734  TempSrc: Oral  PainSc: 0-No pain         Complications: No notable events documented.

## 2022-04-19 NOTE — Anesthesia Preprocedure Evaluation (Addendum)
Anesthesia Evaluation  Patient identified by MRN, date of birth, ID band Patient awake    Reviewed: Allergy & Precautions, NPO status , Patient's Chart, lab work & pertinent test results  History of Anesthesia Complications (+) PONV and history of anesthetic complications  Airway Mallampati: I       Dental no notable dental hx. (+) Teeth Intact   Pulmonary asthma , former smoker,    Pulmonary exam normal        Cardiovascular negative cardio ROS Normal cardiovascular exam     Neuro/Psych  Headaches, PSYCHIATRIC DISORDERS Anxiety Depression    GI/Hepatic Neg liver ROS, GERD  Medicated and Controlled,  Endo/Other    Renal/GU negative Renal ROS  negative genitourinary   Musculoskeletal negative musculoskeletal ROS (+)   Abdominal Normal abdominal exam  (+)   Peds  Hematology   Anesthesia Other Findings   Reproductive/Obstetrics                             Anesthesia Physical Anesthesia Plan  ASA: 2  Anesthesia Plan: General   Post-op Pain Management: Minimal or no pain anticipated   Induction: Intravenous  PONV Risk Score and Plan: 4 or greater and Ondansetron, Dexamethasone, Midazolam and Scopolamine patch - Pre-op  Airway Management Planned: LMA  Additional Equipment: None  Intra-op Plan:   Post-operative Plan: Extubation in OR  Informed Consent: I have reviewed the patients History and Physical, chart, labs and discussed the procedure including the risks, benefits and alternatives for the proposed anesthesia with the patient or authorized representative who has indicated his/her understanding and acceptance.     Dental advisory given  Plan Discussed with: CRNA  Anesthesia Plan Comments:        Anesthesia Quick Evaluation

## 2022-04-19 NOTE — Anesthesia Postprocedure Evaluation (Signed)
Anesthesia Post Note  Patient: Heather Morales  Procedure(s) Performed: EXCISION OSTEOMAS OF FOREHEAD: RIGHT MIDDLE FOREHEAD - 7 MM; RIGHT INFERIOR FOREHEAD - 5 MM; RIGHT FRONTAL SUPERIOR FOREHEAD AT HAIRLINE; LEFT FRONTAL AT HAIRLINE (Bilateral: Face)     Patient location during evaluation: PACU Anesthesia Type: General Level of consciousness: awake Pain management: pain level controlled Vital Signs Assessment: post-procedure vital signs reviewed and stable Respiratory status: spontaneous breathing Cardiovascular status: stable Postop Assessment: no apparent nausea or vomiting Anesthetic complications: no   No notable events documented.  Last Vitals:  Vitals:   04/19/22 1015 04/19/22 1030  BP: 121/74 135/89  Pulse: 84 80  Resp: 16 18  Temp:  36.4 C  SpO2: 97% 94%    Last Pain:  Vitals:   04/19/22 1030  TempSrc: Oral  PainSc: 0-No pain                 Huston Foley

## 2022-04-19 NOTE — Op Note (Signed)
Operative Note   DATE OF OPERATION: 04/19/2022  SURGICAL DEPARTMENT: Plastic Surgery  PREOPERATIVE DIAGNOSES:  multiple osteomas  POSTOPERATIVE DIAGNOSES:  same  PROCEDURE:   1) excision of right middle frontal osteoma 78m 2) excision of right inferior frontal osteoma 5 mm 3) excision of right superior frontal osteoma 5 mm 4) excision of left frontal osteoma at hairline 5 mm 5) complex closure of forehead wounds totaling 5.5 cm  SURGEON: Aladdin Kollmann P. Kadince Boxley, MD  ASSISTANT: GKrista Blue PA  ANESTHESIA:  General.   COMPLICATIONS: None.   INDICATIONS FOR PROCEDURE:  The patient, Heather Rogoffis a 61y.o. female born on [birth year] is here for treatment of multiple osteomas MRN: [id number] CONSENT:  Informed consent was obtained directly from the patient. Risks, benefits and alternatives were fully discussed. Specific risks including but not limited to bleeding, infection, hematoma, seroma, scarring, pain, contracture, asymmetry, wound healing problems, and need for further surgery were all discussed. The patient did have an ample opportunity to have questions answered to satisfaction.   DESCRIPTION OF PROCEDURE:  The patient was taken to the operating room. SCDs were placed and preoperative antibiotics were given. General anesthesia was administered.  The patient's operative site was prepped and draped in a sterile fashion. A time out was performed and all information was confirmed to be correct.    The forehead was injected with quarter Marcaine with epinephrine.  3 separate incisions were made with a 15 blade.  In each case osteomas were exposed and an osteotome was used to remove them.  They were sent to pathology.  Pineapple bur was used to bur the bone so that was flush with the frontal bone.  4 separate lesions were removed as noted above in the procedure section and sent separately to pathology for permanent analysis.  The wounds were all widely undermined and then closed in a  layered fashioned with 5-0 Monocryl and 5 Prolene.     The advanced practice practitioner (APP) assisted throughout the case.  The APP was essential in retraction and counter traction when needed to make the case progress smoothly.  This retraction and assistance made it possible to see the tissue planes for the procedure.  The assistance was needed for hemostasis, tissue re-approximation and closure of the incision site.    The patient tolerated the procedure well.  There were no complications. The patient was allowed to wake from anesthesia, extubated and taken to the recovery room in satisfactory condition.

## 2022-04-19 NOTE — Discharge Instructions (Addendum)
Activity: As tolerated, but avoid strenuous activity until follow up visit.  Diet: Regular  Wound Care: Your excision sites were repaired with Prolene non-absorbable stitches.  We then applied a thin film of bacitracin over each of the repair sites.  Please apply Vaseline 1-2 times daily for the next week.  We will remove your stitches at your scheduled post-op visit.    Pain control: Recommend ibuprofen 600 mg every 6 hours as well as Tylenol 500 mg every 6 hours for pain control, as needed.  Please take the oxycodone only as needed for breakthrough pain.    Special Instructions: Call our office if any unusual problems occur such as pain, excessive bleeding, unrelieved nausea/vomiting, fever &/or chills.  Follow-up appointment: Scheduled for next week.     Post Anesthesia Home Care Instructions  Activity: Get plenty of rest for the remainder of the day. A responsible individual must stay with you for 24 hours following the procedure.  For the next 24 hours, DO NOT: -Drive a car -Paediatric nurse -Drink alcoholic beverages -Take any medication unless instructed by your physician -Make any legal decisions or sign important papers.  Meals: Start with liquid foods such as gelatin or soup. Progress to regular foods as tolerated. Avoid greasy, spicy, heavy foods. If nausea and/or vomiting occur, drink only clear liquids until the nausea and/or vomiting subsides. Call your physician if vomiting continues.  Special Instructions/Symptoms: Your throat may feel dry or sore from the anesthesia or the breathing tube placed in your throat during surgery. If this causes discomfort, gargle with warm salt water. The discomfort should disappear within 24 hours.  If you had a scopolamine patch placed behind your ear for the management of post- operative nausea and/or vomiting:  1. The medication in the patch is effective for 72 hours, after which it should be removed.  Wrap patch in a tissue and  discard in the trash. Wash hands thoroughly with soap and water. 2. You may remove the patch earlier than 72 hours if you experience unpleasant side effects which may include dry mouth, dizziness or visual disturbances. 3. Avoid touching the patch. Wash your hands with soap and water after contact with the patch.

## 2022-04-20 ENCOUNTER — Encounter (HOSPITAL_BASED_OUTPATIENT_CLINIC_OR_DEPARTMENT_OTHER): Payer: Self-pay | Admitting: Plastic Surgery

## 2022-04-20 LAB — SURGICAL PATHOLOGY

## 2022-04-22 ENCOUNTER — Encounter: Payer: 59 | Admitting: Plastic Surgery

## 2022-04-29 ENCOUNTER — Ambulatory Visit (INDEPENDENT_AMBULATORY_CARE_PROVIDER_SITE_OTHER): Payer: 59 | Admitting: Plastic Surgery

## 2022-04-29 ENCOUNTER — Encounter: Payer: 59 | Admitting: Plastic Surgery

## 2022-04-29 DIAGNOSIS — D169 Benign neoplasm of bone and articular cartilage, unspecified: Secondary | ICD-10-CM

## 2022-04-29 NOTE — Progress Notes (Signed)
Status post removal multiple osteomas.  Doing well.  Physical exam Incisions clean dry intact, no hematoma or seroma  A. BONE, RIGHT MIDDLE FOREHEAD, EXCISION:  Osteoma, NOS.  Negative for malignancy.   B. BONE, RIGHT INFERIOR FOREHEAD, EXCISION:  Osteoma, NOS.  Negative for malignancy.   C. BONE, RIGHT FRONTAL SUPERIOR FOREHEAD, EXCISION:  Osteoma, NOS.  Negative for malignancy.   D. BONE, RIGHT FRONTAL FOREHEAD AT HAIRLINE, EXCISION:  Osteoma, NOS.  Negative for malignancy.   Assessment and plan Patient doing well status post removal of osteomas.  Pathology reviewed with patient and we discussed treatment of scars including sunscreen, scar massage and silicone sheeting if she wishes.  We will see her back as needed if she has any questions about her scars are healing.

## 2022-05-03 ENCOUNTER — Other Ambulatory Visit (HOSPITAL_COMMUNITY): Payer: Self-pay

## 2022-05-03 ENCOUNTER — Encounter (INDEPENDENT_AMBULATORY_CARE_PROVIDER_SITE_OTHER): Payer: Self-pay | Admitting: Family Medicine

## 2022-05-03 ENCOUNTER — Ambulatory Visit (INDEPENDENT_AMBULATORY_CARE_PROVIDER_SITE_OTHER): Payer: 59 | Admitting: Family Medicine

## 2022-05-03 VITALS — BP 131/82 | HR 74 | Temp 97.7°F | Ht 66.0 in | Wt 178.0 lb

## 2022-05-03 DIAGNOSIS — E669 Obesity, unspecified: Secondary | ICD-10-CM | POA: Diagnosis not present

## 2022-05-03 DIAGNOSIS — Z6828 Body mass index (BMI) 28.0-28.9, adult: Secondary | ICD-10-CM | POA: Diagnosis not present

## 2022-05-03 DIAGNOSIS — F439 Reaction to severe stress, unspecified: Secondary | ICD-10-CM

## 2022-05-03 DIAGNOSIS — E8881 Metabolic syndrome: Secondary | ICD-10-CM | POA: Diagnosis not present

## 2022-05-03 MED ORDER — TIRZEPATIDE 10 MG/0.5ML ~~LOC~~ SOAJ
10.0000 mg | SUBCUTANEOUS | 0 refills | Status: DC
  Filled 2022-05-03 – 2022-05-09 (×2): qty 6, 84d supply, fill #0

## 2022-05-09 ENCOUNTER — Other Ambulatory Visit (HOSPITAL_COMMUNITY): Payer: Self-pay

## 2022-05-10 NOTE — Progress Notes (Unsigned)
Chief Complaint:   OBESITY Heather Morales is here to discuss her progress with her obesity treatment plan along with follow-up of her obesity related diagnoses. Heather Morales is on keeping a food journal and adhering to recommended goals of 1200 calories and 80+ grams of protein daily and states she is following her eating plan approximately 50% of the time. Heather Morales states she is doing 0 minutes 0 times per week.  Today's visit was #: 28 Starting weight: 225 lbs Starting date: 10/15/2020 Today's weight: 178 lbs Today's date: 05/03/2022 Total lbs lost to date: 23 Total lbs lost since last in-office visit: 0  Interim History: Heather Morales has done well with maintaining her weight.  She has had some challenges at home and at work, and she has not been able to start strengthening exercises yet.  Subjective:   1. Stress Heather Morales has increased stress with work and her sister.  She has not been able to add exercise regularly yet, but she is trying to avoid emotional eating behaviors.  2. Insulin resistance Heather Morales started on Mounjaro, and she denies nausea or vomiting.  She is doing well with her diet and weight loss.  Assessment/Plan:   1. Stress Heather Morales was offered support and encouragement, and emotional eating behavior strategies were discussed.  2. Insulin resistance Heather Morales will continue Mounjaro 10 mg once weekly, we will refill for 1 month.  - tirzepatide (MOUNJARO) 10 MG/0.5ML Pen; Inject 10 mg into the skin once a week.  Dispense: 6 mL; Refill: 0  3. Obesity, Current BMI 28.9 Heather Morales is currently in the action stage of change. As such, her goal is to continue with weight loss efforts. She has agreed to keeping a food journal and adhering to recommended goals of 1200 calories and 80+ grams of protein daily.   We reviewed fat percentage, visceral fat readings, and muscle mass from the bioimpedance scale.  Heather Morales will try to add some strengthening exercises in small amounts to start.  Behavioral  modification strategies: increasing lean protein intake.  Hope has agreed to follow-up with our clinic in 4 weeks. She was informed of the importance of frequent follow-up visits to maximize her success with intensive lifestyle modifications for her multiple health conditions.   Objective:   Blood pressure 131/82, pulse 74, temperature 97.7 F (36.5 C), height '5\' 6"'$  (1.676 m), weight 178 lb (80.7 kg), SpO2 99 %. Body mass index is 28.73 kg/m.  General: Cooperative, alert, well developed, in no acute distress. HEENT: Conjunctivae and lids unremarkable. Cardiovascular: Regular rhythm.  Lungs: Normal work of breathing. Neurologic: No focal deficits.   Lab Results  Component Value Date   CREATININE 0.71 01/19/2022   BUN 24 01/19/2022   NA 142 01/19/2022   K 4.8 01/19/2022   CL 104 01/19/2022   CO2 16 (L) 01/19/2022   Lab Results  Component Value Date   ALT 25 01/19/2022   AST 17 01/19/2022   ALKPHOS 107 01/19/2022   BILITOT 0.3 01/19/2022   Lab Results  Component Value Date   HGBA1C 5.3 01/19/2022   HGBA1C 5.2 08/10/2021   HGBA1C 5.5 10/15/2020   HGBA1C 5.2 10/31/2018   Lab Results  Component Value Date   INSULIN 33.8 (H) 08/10/2021   INSULIN 29.3 (H) 10/15/2020   INSULIN 30.9 (H) 10/31/2018   Lab Results  Component Value Date   TSH 2.220 08/10/2021   Lab Results  Component Value Date   CHOL 165 08/10/2021   HDL 51 08/10/2021   LDLCALC 99 08/10/2021  TRIG 79 08/10/2021   Lab Results  Component Value Date   VD25OH 64.9 01/19/2022   VD25OH 91.7 08/10/2021   VD25OH 84.2 10/15/2020   Lab Results  Component Value Date   WBC 8.0 10/15/2020   HGB 15.4 10/15/2020   HCT 44.8 10/15/2020   MCV 95 10/15/2020   PLT 267 10/15/2020   No results found for: "IRON", "TIBC", "FERRITIN"  Attestation Statements:   Reviewed by clinician on day of visit: allergies, medications, problem list, medical history, surgical history, family history, social history, and  previous encounter notes.   I, Trixie Dredge, am acting as transcriptionist for Dennard Nip, MD.  I have reviewed the above documentation for accuracy and completeness, and I agree with the above. -  Dennard Nip, MD

## 2022-05-18 ENCOUNTER — Encounter (INDEPENDENT_AMBULATORY_CARE_PROVIDER_SITE_OTHER): Payer: Self-pay

## 2022-05-28 ENCOUNTER — Other Ambulatory Visit (HOSPITAL_COMMUNITY): Payer: Self-pay

## 2022-05-28 MED ORDER — PEG 3350-KCL-NA BICARB-NACL 420 G PO SOLR
ORAL | 0 refills | Status: DC
  Filled 2022-05-28: qty 4000, 1d supply, fill #0

## 2022-06-08 ENCOUNTER — Other Ambulatory Visit (HOSPITAL_COMMUNITY): Payer: Self-pay

## 2022-06-08 ENCOUNTER — Encounter (INDEPENDENT_AMBULATORY_CARE_PROVIDER_SITE_OTHER): Payer: Self-pay | Admitting: Family Medicine

## 2022-06-08 ENCOUNTER — Ambulatory Visit (INDEPENDENT_AMBULATORY_CARE_PROVIDER_SITE_OTHER): Payer: 59 | Admitting: Family Medicine

## 2022-06-08 ENCOUNTER — Encounter (INDEPENDENT_AMBULATORY_CARE_PROVIDER_SITE_OTHER): Payer: Self-pay

## 2022-06-08 ENCOUNTER — Telehealth (INDEPENDENT_AMBULATORY_CARE_PROVIDER_SITE_OTHER): Payer: Self-pay | Admitting: Family Medicine

## 2022-06-08 VITALS — BP 108/74 | HR 71 | Temp 98.6°F | Ht 66.0 in | Wt 179.0 lb

## 2022-06-08 DIAGNOSIS — Z6829 Body mass index (BMI) 29.0-29.9, adult: Secondary | ICD-10-CM

## 2022-06-08 DIAGNOSIS — R7303 Prediabetes: Secondary | ICD-10-CM | POA: Diagnosis not present

## 2022-06-08 DIAGNOSIS — E669 Obesity, unspecified: Secondary | ICD-10-CM

## 2022-06-08 DIAGNOSIS — E559 Vitamin D deficiency, unspecified: Secondary | ICD-10-CM | POA: Diagnosis not present

## 2022-06-08 MED ORDER — WEGOVY 0.25 MG/0.5ML ~~LOC~~ SOAJ
0.2500 mg | SUBCUTANEOUS | 0 refills | Status: DC
  Filled 2022-06-08: qty 2, 28d supply, fill #0

## 2022-06-08 NOTE — Telephone Encounter (Signed)
Dr. Leafy Ro - Prior authorization denied for [facility]. Per insurance: not a covered benefit and excluded from coverage. Patient sent denial message via mychart.

## 2022-06-14 NOTE — Progress Notes (Signed)
Chief Complaint:   OBESITY Heather Morales is here to discuss her progress with her obesity treatment plan along with follow-up of her obesity related diagnoses. Heather Morales is on keeping a food journal and adhering to recommended goals of 1200 calories and 80+ grams of protein daily and states she is following her eating plan approximately 50% of the time. Heather Morales states she is doing 0 minutes 0 times per week.  Today's visit was #: 68 Starting weight: 225 lbs Starting date: 10/15/2020 Today's weight: 179 lbs Today's date: 06/08/2022 Total lbs lost to date: 46 Total lbs lost since last in-office visit: 0  Interim History: Heather Morales has done well with minimizing weight gain.  She is journaling on and off, but she is struggling off her GLP-one due to insurance denial.  She may have coverage for [facility].  Subjective:   1. Vitamin D deficiency Heather Morales is stable on OTC vitamin D.  Her last vitamin D level was at goal, and she is due for labs.  2. Prediabetes Heather Morales has been off of Mounjaro due to lack of insurance coverage.  She notes increased PM cravings more than polyphagia.  Assessment/Plan:   1. Vitamin D deficiency Heather Morales will continue OTC vitamin D, and she will follow-up for routine testing of Vitamin D, at least 2-3 times per year to avoid over-replacement.  2. Prediabetes Heather Morales will try another GLP-1, and she will continue with her diet.  3. Obesity, Current BMI 29.0 Heather Morales is currently in the action stage of change. As such, her goal is to continue with weight loss efforts. She has agreed to keeping a food journal and adhering to recommended goals of 1200 calories and 80+ grams of protein daily.   We will recheck fasting labs at her next visit.  We discussed various medication options to help Heather Morales with her weight loss efforts and we both agreed to start Wegovy 0.25 mg once weekly with no refills.   - Semaglutide-Weight Management (WEGOVY) 0.25 MG/0.5ML SOAJ; Inject 0.25 mg into the  skin once a week.  Dispense: 2 mL; Refill: 0  Behavioral modification strategies: emotional eating strategies, travel eating strategies, and celebration eating strategies.  Heather Morales has agreed to follow-up with our clinic in 4 to 5 weeks. She was informed of the importance of frequent follow-up visits to maximize her success with intensive lifestyle modifications for her multiple health conditions.   Objective:   Blood pressure 108/74, pulse 71, temperature 98.6 F (37 C), height '5\' 6"'$  (1.676 m), weight 179 lb (81.2 kg), SpO2 97 %. Body mass index is 28.89 kg/m.  General: Cooperative, alert, well developed, in no acute distress. HEENT: Conjunctivae and lids unremarkable. Cardiovascular: Regular rhythm.  Lungs: Normal work of breathing. Neurologic: No focal deficits.   Lab Results  Component Value Date   CREATININE 0.71 01/19/2022   BUN 24 01/19/2022   NA 142 01/19/2022   K 4.8 01/19/2022   CL 104 01/19/2022   CO2 16 (L) 01/19/2022   Lab Results  Component Value Date   ALT 25 01/19/2022   AST 17 01/19/2022   ALKPHOS 107 01/19/2022   BILITOT 0.3 01/19/2022   Lab Results  Component Value Date   HGBA1C 5.3 01/19/2022   HGBA1C 5.2 08/10/2021   HGBA1C 5.5 10/15/2020   HGBA1C 5.2 10/31/2018   Lab Results  Component Value Date   INSULIN 33.8 (H) 08/10/2021   INSULIN 29.3 (H) 10/15/2020   INSULIN 30.9 (H) 10/31/2018   Lab Results  Component Value Date  TSH 2.220 08/10/2021   Lab Results  Component Value Date   CHOL 165 08/10/2021   HDL 51 08/10/2021   LDLCALC 99 08/10/2021   TRIG 79 08/10/2021   Lab Results  Component Value Date   VD25OH 64.9 01/19/2022   VD25OH 91.7 08/10/2021   VD25OH 84.2 10/15/2020   Lab Results  Component Value Date   WBC 8.0 10/15/2020   HGB 15.4 10/15/2020   HCT 44.8 10/15/2020   MCV 95 10/15/2020   PLT 267 10/15/2020   No results found for: "IRON", "TIBC", "FERRITIN"  Attestation Statements:   Reviewed by clinician on day of  visit: allergies, medications, problem list, medical history, surgical history, family history, social history, and previous encounter notes.   I, Trixie Dredge, am acting as transcriptionist for Dennard Nip, MD.  I have reviewed the above documentation for accuracy and completeness, and I agree with the above. -  Dennard Nip, MD

## 2022-06-23 ENCOUNTER — Other Ambulatory Visit (HOSPITAL_COMMUNITY): Payer: Self-pay

## 2022-07-12 ENCOUNTER — Ambulatory Visit (INDEPENDENT_AMBULATORY_CARE_PROVIDER_SITE_OTHER): Payer: 59 | Admitting: Family Medicine

## 2022-07-27 ENCOUNTER — Ambulatory Visit (INDEPENDENT_AMBULATORY_CARE_PROVIDER_SITE_OTHER): Payer: 59 | Admitting: Family Medicine

## 2022-07-27 ENCOUNTER — Encounter (INDEPENDENT_AMBULATORY_CARE_PROVIDER_SITE_OTHER): Payer: Self-pay | Admitting: Family Medicine

## 2022-07-27 ENCOUNTER — Other Ambulatory Visit (HOSPITAL_COMMUNITY): Payer: Self-pay

## 2022-07-27 VITALS — BP 146/74 | HR 68 | Temp 97.7°F | Ht 66.0 in | Wt 190.0 lb

## 2022-07-27 DIAGNOSIS — Z6834 Body mass index (BMI) 34.0-34.9, adult: Secondary | ICD-10-CM | POA: Insufficient documentation

## 2022-07-27 DIAGNOSIS — F3289 Other specified depressive episodes: Secondary | ICD-10-CM | POA: Diagnosis not present

## 2022-07-27 DIAGNOSIS — R030 Elevated blood-pressure reading, without diagnosis of hypertension: Secondary | ICD-10-CM

## 2022-07-27 DIAGNOSIS — E669 Obesity, unspecified: Secondary | ICD-10-CM

## 2022-07-27 DIAGNOSIS — E88819 Insulin resistance, unspecified: Secondary | ICD-10-CM

## 2022-07-27 DIAGNOSIS — Z6830 Body mass index (BMI) 30.0-30.9, adult: Secondary | ICD-10-CM

## 2022-07-27 MED ORDER — BUPROPION HCL ER (XL) 300 MG PO TB24
300.0000 mg | ORAL_TABLET | ORAL | 0 refills | Status: DC
  Filled 2022-07-27: qty 30, 30d supply, fill #0

## 2022-07-27 MED ORDER — METFORMIN HCL 500 MG PO TABS
500.0000 mg | ORAL_TABLET | Freq: Every day | ORAL | 0 refills | Status: DC
  Filled 2022-07-27: qty 30, 30d supply, fill #0

## 2022-07-27 NOTE — Progress Notes (Signed)
Office: [phone]  /  Fax: [phone]   Total lbs lost to date: 35 Total lbs lost since last in-office visit: +11     BP (!) 146/74   Pulse 68   Temp 97.7 F (36.5 C)   Ht '5\' 6"'$  (1.676 m)   Wt 190 lb (86.2 kg)   SpO2 98%   BMI 30.67 kg/m  She was weighed on the bioimpedance scale:  Body mass index is 30.67 kg/m.  General:  Alert, oriented and cooperative. Patient is in no acute distress.  Mental Status: Normal mood and affect. Normal behavior. Normal judgment and thought content.        Patient past medical history includes:   Past Medical History:  Diagnosis Date   Allergic rhinitis    Anxiety    Asthma    followed by pcp   Borderline hypertension    followed by pcp;   no medication   Environmental and seasonal allergies    GERD (gastroesophageal reflux disease)    History of adenomatous polyp of colon    History of skin cancer    MDD (major depressive disorder)    Migraines    Osteoma of face    forehead   PONV (postoperative nausea and vomiting)    Pre-diabetes    Prediabetes    History of Present Illness The patient presents with concerns about weight gain and struggles with managing their weight. They report feeling stressed and have noticed an elevated blood pressure reading during the visit, which is not typical for them. The patient denies experiencing chest pain or headaches. They mention feeling hungrier than usual, which they attribute to being off their previous weight loss medication, Mounjaro, due to insurance issues. The patient has tried another medication, Mancel Parsons, but their insurance denied coverage for it as well.  The patient acknowledges that they need to be mindful of their food intake and watch every bite they consume. They express frustration with their narrow range for weight loss and the difficulty of maintaining their metabolism. The patient is seeking assistance in managing their hunger and cravings to help them better control their  weight.  Previously, the patient was on [facility], which they found to be effective in managing their weight. However, due to insurance issues, they are no longer able to afford the medication. They have also tried [place], but their insurance denied coverage for it. The patient has been on a pill for weight management before trying the injections but did not specify its effectiveness.  In summary, the patient is struggling with weight management and is seeking assistance in controlling their hunger and cravings. They have tried various medications in the past, but insurance issues have limited their access to effective treatments. The patient is concerned about their elevated blood pressure during the visit and acknowledges the need to be more mindful of their food intake.  Assessment & Plan Obesity: Patient has gained weight, with a portion of the weight gain attributed to water retention. Patient reports increased hunger since discontinuing Wegovy (semaglutide) injections. -Encourage patient to monitor intake of simple carbohydrates and sodium, which can contribute to fluid retention. -Start Metformin to help manage hunger and insulin resistance. Instruct patient to take one pill daily with the first meal of the day. Monitor for potential side effects such as nausea and diarrhea.  Emotional eating behaviors  -Continue Bupropion; refill prescription as needed.  Elevated blood Pressure: Patient reports elevated blood pressure at today's visit, potentially related to stress. -Advise patient to  monitor blood pressure and report if consistently elevated.  Follow-up in 4 weeks to assess response to Metformin and discuss strategies for managing weight and health during the holiday season.  Dennard Nip, MD

## 2022-08-09 ENCOUNTER — Other Ambulatory Visit (INDEPENDENT_AMBULATORY_CARE_PROVIDER_SITE_OTHER): Payer: Self-pay | Admitting: Family Medicine

## 2022-08-09 ENCOUNTER — Other Ambulatory Visit (HOSPITAL_COMMUNITY): Payer: Self-pay

## 2022-08-09 ENCOUNTER — Encounter (INDEPENDENT_AMBULATORY_CARE_PROVIDER_SITE_OTHER): Payer: Self-pay | Admitting: Family Medicine

## 2022-08-09 DIAGNOSIS — E88819 Insulin resistance, unspecified: Secondary | ICD-10-CM

## 2022-08-09 MED ORDER — METFORMIN HCL 1000 MG PO TABS
1000.0000 mg | ORAL_TABLET | Freq: Every day | ORAL | 0 refills | Status: DC
  Filled 2022-08-09: qty 30, 30d supply, fill #0

## 2022-08-10 ENCOUNTER — Other Ambulatory Visit (HOSPITAL_COMMUNITY): Payer: Self-pay

## 2022-08-11 ENCOUNTER — Other Ambulatory Visit (HOSPITAL_COMMUNITY): Payer: Self-pay

## 2022-08-24 ENCOUNTER — Ambulatory Visit (INDEPENDENT_AMBULATORY_CARE_PROVIDER_SITE_OTHER): Payer: 59 | Admitting: Family Medicine

## 2022-08-24 ENCOUNTER — Other Ambulatory Visit (HOSPITAL_COMMUNITY): Payer: Self-pay

## 2022-08-24 ENCOUNTER — Encounter (INDEPENDENT_AMBULATORY_CARE_PROVIDER_SITE_OTHER): Payer: Self-pay | Admitting: Family Medicine

## 2022-08-24 VITALS — BP 134/84 | HR 69 | Temp 97.3°F | Ht 66.0 in | Wt 188.0 lb

## 2022-08-24 DIAGNOSIS — F3289 Other specified depressive episodes: Secondary | ICD-10-CM | POA: Diagnosis not present

## 2022-08-24 DIAGNOSIS — E669 Obesity, unspecified: Secondary | ICD-10-CM | POA: Diagnosis not present

## 2022-08-24 DIAGNOSIS — Z6830 Body mass index (BMI) 30.0-30.9, adult: Secondary | ICD-10-CM

## 2022-08-24 DIAGNOSIS — E88819 Insulin resistance, unspecified: Secondary | ICD-10-CM

## 2022-08-24 MED ORDER — BUPROPION HCL ER (XL) 300 MG PO TB24
300.0000 mg | ORAL_TABLET | Freq: Every morning | ORAL | 0 refills | Status: DC
  Filled 2022-08-24: qty 30, 30d supply, fill #0

## 2022-08-24 MED ORDER — METFORMIN HCL 1000 MG PO TABS
1000.0000 mg | ORAL_TABLET | Freq: Every day | ORAL | 0 refills | Status: DC
  Filled 2022-08-24 – 2022-09-01 (×3): qty 30, 30d supply, fill #0

## 2022-08-27 ENCOUNTER — Other Ambulatory Visit (HOSPITAL_COMMUNITY): Payer: Self-pay

## 2022-09-01 ENCOUNTER — Other Ambulatory Visit (HOSPITAL_COMMUNITY): Payer: Self-pay

## 2022-09-02 ENCOUNTER — Other Ambulatory Visit (HOSPITAL_COMMUNITY): Payer: Self-pay

## 2022-09-06 NOTE — Progress Notes (Signed)
Chief Complaint:   OBESITY Heather Morales is here to discuss her progress with her obesity treatment plan along with follow-up of her obesity related diagnoses. Heather Morales is on keeping a food journal and adhering to recommended goals of 1200 calories and 80+ grams of protein and states she is following her eating plan approximately 40% of the time. Heather Morales states she is doing 0 minutes 0 times per week.  Today's visit was #: 54 Starting weight: 225 lbs Starting date: 10/15/2020 Today's weight: 188 lbs Today's date: 08/24/2022 Total lbs lost to date: 37 Total lbs lost since last in-office visit: 2  Interim History: Heather Morales has done well with her weight loss.  She travel to [place] and walked a great deal.  Subjective:   1. Insulin resistance Heather Morales is taking metformin with no side effects noted..  2. Other depression with emotional eating Heather Morales taking Wellbutrin XL with no side effects noted.  Assessment/Plan:   1. Insulin resistance Heather Morales will continue metformin, and we will continue with her eating plan and exercise.  We will refill metformin for 1 month.  - metFORMIN (GLUCOPHAGE) 1000 MG tablet; Take 1 tablet (1,000 mg total) by mouth daily with breakfast.  Dispense: 30 tablet; Refill: 0  2. Other depression with emotional eating Heather Morales will continue Wellbutrin XL, her eating plan and exercise.  We will refill Wellbutrin XL for 1 month.  - buPROPion (WELLBUTRIN XL) 300 MG 24 hr tablet; Take 1 tablet (300 mg total) by mouth every morning.  Dispense: 30 tablet; Refill: 0  3. Obesity, Current BMI 30.5 Heather Morales is currently in the action stage of change. As such, her goal is to continue with weight loss efforts. She has agreed to keeping a food journal and adhering to recommended goals of 1200 calories and 80+ grams of protein daily.   Exercise goals: All adults should avoid inactivity. Some physical activity is better than none, and adults who participate in any amount of physical activity  gain some health benefits.  Behavioral modification strategies: increasing lean protein intake, decreasing simple carbohydrates, emotional eating strategies, and holiday eating strategies .  Heather Morales has agreed to follow-up with our clinic in 4 weeks. She was informed of the importance of frequent follow-up visits to maximize her success with intensive lifestyle modifications for her multiple health conditions.   Objective:   Blood pressure 134/84, pulse 69, temperature (!) 97.3 F (36.3 C), height '5\' 6"'$  (1.676 m), weight 188 lb (85.3 kg), SpO2 99 %. Body mass index is 30.34 kg/m.  General: Cooperative, alert, well developed, in no acute distress. HEENT: Conjunctivae and lids unremarkable. Cardiovascular: Regular rhythm.  Lungs: Normal work of breathing. Neurologic: No focal deficits.   Lab Results  Component Value Date   CREATININE 0.71 01/19/2022   BUN 24 01/19/2022   NA 142 01/19/2022   K 4.8 01/19/2022   CL 104 01/19/2022   CO2 16 (L) 01/19/2022   Lab Results  Component Value Date   ALT 25 01/19/2022   AST 17 01/19/2022   ALKPHOS 107 01/19/2022   BILITOT 0.3 01/19/2022   Lab Results  Component Value Date   HGBA1C 5.3 01/19/2022   HGBA1C 5.2 08/10/2021   HGBA1C 5.5 10/15/2020   HGBA1C 5.2 10/31/2018   Lab Results  Component Value Date   INSULIN 33.8 (H) 08/10/2021   INSULIN 29.3 (H) 10/15/2020   INSULIN 30.9 (H) 10/31/2018   Lab Results  Component Value Date   TSH 2.220 08/10/2021   Lab Results  Component  Value Date   CHOL 165 08/10/2021   HDL 51 08/10/2021   LDLCALC 99 08/10/2021   TRIG 79 08/10/2021   Lab Results  Component Value Date   VD25OH 64.9 01/19/2022   VD25OH 91.7 08/10/2021   VD25OH 84.2 10/15/2020   Lab Results  Component Value Date   WBC 8.0 10/15/2020   HGB 15.4 10/15/2020   HCT 44.8 10/15/2020   MCV 95 10/15/2020   PLT 267 10/15/2020   No results found for: "IRON", "TIBC", "FERRITIN"  Attestation Statements:   Reviewed by  clinician on day of visit: allergies, medications, problem list, medical history, surgical history, family history, social history, and previous encounter notes.   I, Trixie Dredge, am acting as transcriptionist for Dennard Nip, MD.  I have reviewed the above documentation for accuracy and completeness, and I agree with the above. -  Dennard Nip, MD

## 2022-09-22 ENCOUNTER — Encounter (INDEPENDENT_AMBULATORY_CARE_PROVIDER_SITE_OTHER): Payer: Self-pay | Admitting: Family Medicine

## 2022-09-22 ENCOUNTER — Ambulatory Visit (INDEPENDENT_AMBULATORY_CARE_PROVIDER_SITE_OTHER): Payer: 59 | Admitting: Family Medicine

## 2022-09-22 ENCOUNTER — Other Ambulatory Visit (HOSPITAL_COMMUNITY): Payer: Self-pay

## 2022-09-22 VITALS — BP 136/78 | HR 63 | Temp 98.0°F | Ht 66.0 in | Wt 192.0 lb

## 2022-09-22 DIAGNOSIS — E669 Obesity, unspecified: Secondary | ICD-10-CM

## 2022-09-22 DIAGNOSIS — E559 Vitamin D deficiency, unspecified: Secondary | ICD-10-CM | POA: Diagnosis not present

## 2022-09-22 DIAGNOSIS — Z6831 Body mass index (BMI) 31.0-31.9, adult: Secondary | ICD-10-CM | POA: Diagnosis not present

## 2022-09-22 DIAGNOSIS — R7303 Prediabetes: Secondary | ICD-10-CM | POA: Diagnosis not present

## 2022-09-22 MED ORDER — METFORMIN HCL 1000 MG PO TABS
1000.0000 mg | ORAL_TABLET | Freq: Every day | ORAL | 0 refills | Status: DC
  Filled 2022-09-22 – 2022-09-24 (×2): qty 30, 30d supply, fill #0

## 2022-09-24 ENCOUNTER — Other Ambulatory Visit (HOSPITAL_COMMUNITY): Payer: Self-pay

## 2022-09-26 ENCOUNTER — Other Ambulatory Visit (HOSPITAL_COMMUNITY): Payer: Self-pay

## 2022-10-06 NOTE — Progress Notes (Signed)
Chief Complaint:   OBESITY Heather Morales is here to discuss her progress with her obesity treatment plan along with follow-up of her obesity related diagnoses. Consuello is on keeping a food journal and adhering to recommended goals of 1200 calories and 80+ grams of protein and states she is following her eating plan approximately 20% of the time. Cyenna states she is doing 0 minutes 0 times per week.  Today's visit was #: 31 Starting weight: 225 lbs Starting date: 10/15/2020 Today's weight: 192 lbs Today's date: 09/22/2022 Total lbs lost to date: 33 Total lbs lost since last in-office visit: 0  Interim History: Heather Morales is working on increasing her protein and she is using more protein drinks to help her meet her goals.  She notes snacking more in the evenings.  Subjective:   1. Prediabetes Heather Morales's recent A1c was well-controlled at 5.3.  She notes an improvement in polyphagia but not as much as when she was on a GLP-1.  She continues to work on decreasing simple carbohydrates.  2. Vitamin D deficiency Heather Morales is on OTC vitamin D.  Her last vitamin D level was at goal, but she is due to have labs done again.  Assessment/Plan:   1. Prediabetes Heather Morales will continue metformin 1000 mg once daily, and we will refill for 1 month.  She will continue to work on her diet and weight loss.  - metFORMIN (GLUCOPHAGE) 1000 MG tablet; Take 1 tablet (1,000 mg total) by mouth daily with breakfast.  Dispense: 30 tablet; Refill: 0  2. Vitamin D deficiency We will recheck labs and will follow-up at her next visit.   3. Obesity, Current BMI 31.0 Heather Morales is currently in the action stage of change. As such, her goal is to maintain weight for now. She has agreed to keeping a food journal and adhering to recommended goals of 1200 calories and 80+ grams of protein daily.   Heather Morales's goal is to maintain her weight over the holidays.  Behavioral modification strategies: increasing lean protein intake and holiday  eating strategies .  Heather Morales has agreed to follow-up with our clinic in 4 weeks. She was informed of the importance of frequent follow-up visits to maximize her success with intensive lifestyle modifications for her multiple health conditions.   Objective:   Blood pressure 136/78, pulse 63, temperature 98 F (36.7 C), height '5\' 6"'$  (1.676 m), weight 192 lb (87.1 kg), SpO2 97 %. Body mass index is 30.99 kg/m.  General: Cooperative, alert, well developed, in no acute distress. HEENT: Conjunctivae and lids unremarkable. Cardiovascular: Regular rhythm.  Lungs: Normal work of breathing. Neurologic: No focal deficits.   Lab Results  Component Value Date   CREATININE 0.71 01/19/2022   BUN 24 01/19/2022   NA 142 01/19/2022   K 4.8 01/19/2022   CL 104 01/19/2022   CO2 16 (L) 01/19/2022   Lab Results  Component Value Date   ALT 25 01/19/2022   AST 17 01/19/2022   ALKPHOS 107 01/19/2022   BILITOT 0.3 01/19/2022   Lab Results  Component Value Date   HGBA1C 5.3 01/19/2022   HGBA1C 5.2 08/10/2021   HGBA1C 5.5 10/15/2020   HGBA1C 5.2 10/31/2018   Lab Results  Component Value Date   INSULIN 33.8 (H) 08/10/2021   INSULIN 29.3 (H) 10/15/2020   INSULIN 30.9 (H) 10/31/2018   Lab Results  Component Value Date   TSH 2.220 08/10/2021   Lab Results  Component Value Date   CHOL 165 08/10/2021   HDL  51 08/10/2021   LDLCALC 99 08/10/2021   TRIG 79 08/10/2021   Lab Results  Component Value Date   VD25OH 64.9 01/19/2022   VD25OH 91.7 08/10/2021   VD25OH 84.2 10/15/2020   Lab Results  Component Value Date   WBC 8.0 10/15/2020   HGB 15.4 10/15/2020   HCT 44.8 10/15/2020   MCV 95 10/15/2020   PLT 267 10/15/2020   No results found for: "IRON", "TIBC", "FERRITIN"  Attestation Statements:   Reviewed by clinician on day of visit: allergies, medications, problem list, medical history, surgical history, family history, social history, and previous encounter notes.   I, Trixie Dredge, am acting as transcriptionist for Dennard Nip, MD.  I have reviewed the above documentation for accuracy and completeness, and I agree with the above. -  Dennard Nip, MD

## 2022-10-27 ENCOUNTER — Ambulatory Visit (INDEPENDENT_AMBULATORY_CARE_PROVIDER_SITE_OTHER): Payer: 59 | Admitting: Family Medicine

## 2022-11-21 ENCOUNTER — Other Ambulatory Visit (INDEPENDENT_AMBULATORY_CARE_PROVIDER_SITE_OTHER): Payer: Self-pay | Admitting: Family Medicine

## 2022-11-21 DIAGNOSIS — R7303 Prediabetes: Secondary | ICD-10-CM

## 2022-11-23 ENCOUNTER — Ambulatory Visit (INDEPENDENT_AMBULATORY_CARE_PROVIDER_SITE_OTHER): Payer: 59 | Admitting: Family Medicine

## 2022-11-24 ENCOUNTER — Ambulatory Visit (INDEPENDENT_AMBULATORY_CARE_PROVIDER_SITE_OTHER): Payer: 59 | Admitting: Family Medicine

## 2022-11-25 ENCOUNTER — Other Ambulatory Visit (INDEPENDENT_AMBULATORY_CARE_PROVIDER_SITE_OTHER): Payer: Self-pay | Admitting: Family Medicine

## 2022-11-25 ENCOUNTER — Other Ambulatory Visit (HOSPITAL_COMMUNITY): Payer: Self-pay

## 2022-11-25 DIAGNOSIS — R7303 Prediabetes: Secondary | ICD-10-CM

## 2022-11-29 ENCOUNTER — Other Ambulatory Visit (HOSPITAL_COMMUNITY): Payer: Self-pay

## 2022-12-01 ENCOUNTER — Encounter (INDEPENDENT_AMBULATORY_CARE_PROVIDER_SITE_OTHER): Payer: Self-pay | Admitting: Family Medicine

## 2022-12-01 ENCOUNTER — Other Ambulatory Visit (HOSPITAL_COMMUNITY): Payer: Self-pay

## 2022-12-01 ENCOUNTER — Ambulatory Visit (INDEPENDENT_AMBULATORY_CARE_PROVIDER_SITE_OTHER): Payer: 59 | Admitting: Family Medicine

## 2022-12-01 VITALS — BP 151/84 | HR 76 | Temp 97.6°F | Ht 66.0 in | Wt 197.0 lb

## 2022-12-01 DIAGNOSIS — R7303 Prediabetes: Secondary | ICD-10-CM

## 2022-12-01 DIAGNOSIS — Z6831 Body mass index (BMI) 31.0-31.9, adult: Secondary | ICD-10-CM

## 2022-12-01 DIAGNOSIS — E669 Obesity, unspecified: Secondary | ICD-10-CM | POA: Insufficient documentation

## 2022-12-01 DIAGNOSIS — F3289 Other specified depressive episodes: Secondary | ICD-10-CM

## 2022-12-01 MED ORDER — BUPROPION HCL ER (SR) 150 MG PO TB12
150.0000 mg | ORAL_TABLET | Freq: Two times a day (BID) | ORAL | 0 refills | Status: DC
  Filled 2022-12-01: qty 60, 30d supply, fill #0

## 2022-12-01 MED ORDER — METFORMIN HCL 1000 MG PO TABS
1000.0000 mg | ORAL_TABLET | Freq: Every day | ORAL | 0 refills | Status: DC
  Filled 2022-12-01: qty 30, 30d supply, fill #0

## 2022-12-13 NOTE — Progress Notes (Unsigned)
Chief Complaint:   OBESITY Heather Morales is here to discuss her progress with her obesity treatment plan along with follow-up of her obesity related diagnoses. Heather Morales is on keeping a food journal and adhering to recommended goals of 1200 calories and 80+ grams of protein and states she is following her eating plan approximately 0% of the time. Heather Morales states she is doing 0 minutes 0 times per week.  Today's visit was #: 62 Starting weight: 225 lbs Starting date: 10/15/2020 Today's weight: 197 lbs Today's date: 12/01/2022 Total lbs lost to date: 28 Total lbs lost since last in-office visit: 0  Interim History: Heather Morales has been caring for her sister-in-law and she has not been able to concentrate on her health.  She is up a few pounds some of which is water weight.  She is working on getting back on track.  Subjective:   1. Prediabetes Heather Morales continues to work on healthy eating and weight loss.  Her recent A1c has been well-controlled with her efforts.  2. Emotional Eating Behavior Heather Morales is on Wellbutrin XL but she still notes some significant emotional eating behavior.  She was informed that the SD version which is twice daily often helps cravings better than the XL version.  Assessment/Plan:   1. Prediabetes Heather Morales will continue with her diet, exercise, and metformin, and we will refill metformin for 1 month.  - metFORMIN (GLUCOPHAGE) 1000 MG tablet; Take 1 tablet (1,000 mg total) by mouth daily with breakfast.  Dispense: 30 tablet; Refill: 0  2. Emotional Eating Behavior Deem agreed to continue Wellbutrin XL, and changed to Wellbutrin SR 150 mg twice daily with no refills.  - buPROPion (WELLBUTRIN SR) 150 MG 12 hr tablet; Take 1 tablet (150 mg total) by mouth 2 (two) times daily.  Dispense: 60 tablet; Refill: 0  3. BMI 31.0-31.9,adult  4. Obesity, Beginning BMI 34.54 Heather Morales is currently in the action stage of change. As such, her goal is to continue with weight loss efforts. She  has agreed to keeping a food journal and adhering to recommended goals of 1200 calories and 80+ grams of protein daily.   Behavioral modification strategies: increasing lean protein intake, meal planning and cooking strategies, and emotional eating strategies.  Heather Morales has agreed to follow-up with our clinic in 2 weeks. She was informed of the importance of frequent follow-up visits to maximize her success with intensive lifestyle modifications for her multiple health conditions.   Objective:   Blood pressure (!) 151/84, pulse 76, temperature 97.6 F (36.4 C), height '5\' 6"'$  (1.676 m), weight 197 lb (89.4 kg), SpO2 95 %. Body mass index is 31.8 kg/m.  Lab Results  Component Value Date   CREATININE 0.71 01/19/2022   BUN 24 01/19/2022   NA 142 01/19/2022   K 4.8 01/19/2022   CL 104 01/19/2022   CO2 16 (L) 01/19/2022   Lab Results  Component Value Date   ALT 25 01/19/2022   AST 17 01/19/2022   ALKPHOS 107 01/19/2022   BILITOT 0.3 01/19/2022   Lab Results  Component Value Date   HGBA1C 5.3 01/19/2022   HGBA1C 5.2 08/10/2021   HGBA1C 5.5 10/15/2020   HGBA1C 5.2 10/31/2018   Lab Results  Component Value Date   INSULIN 33.8 (H) 08/10/2021   INSULIN 29.3 (H) 10/15/2020   INSULIN 30.9 (H) 10/31/2018   Lab Results  Component Value Date   TSH 2.220 08/10/2021   Lab Results  Component Value Date   CHOL 165 08/10/2021  HDL 51 08/10/2021   LDLCALC 99 08/10/2021   TRIG 79 08/10/2021   Lab Results  Component Value Date   VD25OH 64.9 01/19/2022   VD25OH 91.7 08/10/2021   VD25OH 84.2 10/15/2020   Lab Results  Component Value Date   WBC 8.0 10/15/2020   HGB 15.4 10/15/2020   HCT 44.8 10/15/2020   MCV 95 10/15/2020   PLT 267 10/15/2020   No results found for: "IRON", "TIBC", "FERRITIN"  Attestation Statements:   Reviewed by clinician on day of visit: allergies, medications, problem list, medical history, surgical history, family history, social history, and previous  encounter notes.  I have personally spent 40 minutes total time today in preparation, patient care, and documentation for this visit, including the following: review of clinical lab tests; review of medical tests/procedures/services.   I, Trixie Dredge, am acting as transcriptionist for Dennard Nip, MD.  I have reviewed the above documentation for accuracy and completeness, and I agree with the above. -  Dennard Nip, MD

## 2022-12-14 ENCOUNTER — Ambulatory Visit (INDEPENDENT_AMBULATORY_CARE_PROVIDER_SITE_OTHER): Payer: 59 | Admitting: Family Medicine

## 2022-12-14 ENCOUNTER — Encounter (INDEPENDENT_AMBULATORY_CARE_PROVIDER_SITE_OTHER): Payer: Self-pay | Admitting: Family Medicine

## 2022-12-14 ENCOUNTER — Other Ambulatory Visit (HOSPITAL_COMMUNITY): Payer: Self-pay

## 2022-12-14 VITALS — BP 115/74 | HR 79 | Temp 97.9°F | Ht 66.0 in | Wt 196.0 lb

## 2022-12-14 DIAGNOSIS — R030 Elevated blood-pressure reading, without diagnosis of hypertension: Secondary | ICD-10-CM | POA: Diagnosis not present

## 2022-12-14 DIAGNOSIS — F3289 Other specified depressive episodes: Secondary | ICD-10-CM | POA: Diagnosis not present

## 2022-12-14 DIAGNOSIS — E669 Obesity, unspecified: Secondary | ICD-10-CM | POA: Diagnosis not present

## 2022-12-14 DIAGNOSIS — Z6831 Body mass index (BMI) 31.0-31.9, adult: Secondary | ICD-10-CM

## 2022-12-14 DIAGNOSIS — R7303 Prediabetes: Secondary | ICD-10-CM

## 2022-12-14 MED ORDER — METFORMIN HCL 1000 MG PO TABS
1000.0000 mg | ORAL_TABLET | Freq: Every day | ORAL | 0 refills | Status: DC
  Filled 2022-12-14 – 2022-12-27 (×2): qty 30, 30d supply, fill #0

## 2022-12-14 MED ORDER — BUPROPION HCL ER (SR) 150 MG PO TB12
150.0000 mg | ORAL_TABLET | Freq: Two times a day (BID) | ORAL | 0 refills | Status: AC
  Filled 2022-12-14 – 2022-12-27 (×2): qty 60, 30d supply, fill #0

## 2022-12-15 ENCOUNTER — Other Ambulatory Visit (HOSPITAL_COMMUNITY): Payer: Self-pay

## 2022-12-23 ENCOUNTER — Encounter (INDEPENDENT_AMBULATORY_CARE_PROVIDER_SITE_OTHER): Payer: Self-pay | Admitting: Family Medicine

## 2022-12-27 ENCOUNTER — Other Ambulatory Visit (HOSPITAL_COMMUNITY): Payer: Self-pay

## 2022-12-27 NOTE — Progress Notes (Unsigned)
Chief Complaint:   OBESITY Heather Morales is here to discuss her progress with her obesity treatment plan along with follow-up of her obesity related diagnoses. [name] is on keeping a food journal and adhering to recommended goals of 1200 calories and 80+ grams of protein and states she is following her eating plan approximately 50% of the time. [name] states she is doing 0 minutes 0 times per week.  Today's visit was #: 35 Starting weight: 225 lbs Starting date: 10/15/2020 Today's weight: 196 lbs Today's date: 12/14/2022 Total lbs lost to date: 29 Total lbs lost since last in-office visit: 1  Interim History: [name] continues to do well with weight loss.  She is mindful of her food choices and she is working on meal planning and prepping.  Subjective:   1. Elevated blood-pressure reading without diagnosis of hypertension Josclyn's last blood pressure was elevated.  She is not on antihypertensives.  Her blood pressure is within normal limits today.  2. Prediabetes [name] continues to do well with decreasing simple carbohydrates and taking metformin.  She denies nausea, vomiting, or hypoglycemia.  3. Emotional Eating Behavior [name] changed her Wellbutrin to the p.m. and she feels this has helped decrease her cravings and emotional eating behavior.  Assessment/Plan:   1. Elevated blood-pressure reading without diagnosis of hypertension [name] will continue with her diet and weight loss, and we will continue to monitor.  2. Prediabetes [name] will continue metformin, and we will refill for 1 month.  - metFORMIN (GLUCOPHAGE) 1000 MG tablet; Take 1 tablet (1,000 mg total) by mouth daily with breakfast.  Dispense: 30 tablet; Refill: 0  3. Emotional Eating Behavior [name] will continue Wellbutrin SR, and we will refill for 1 month.  - buPROPion (WELLBUTRIN SR) 150 MG 12 hr tablet; Take 1 tablet (150 mg total) by mouth 2 (two) times daily.  Dispense: 60 tablet; Refill: 0  4. BMI  31.0-31.9,adult  5. Obesity, Beginning BMI 34.54 [name] is currently in the action stage of change. As such, her goal is to continue with weight loss efforts. She has agreed to keeping a food journal and adhering to recommended goals of 1200 calories and 80+ grams of protein daily.   Behavioral modification strategies: increasing lean protein intake.  [name] has agreed to follow-up with our clinic in 4 weeks. She was informed of the importance of frequent follow-up visits to maximize her success with intensive lifestyle modifications for her multiple health conditions.   Objective:   Blood pressure 115/74, pulse 79, temperature 97.9 F (36.6 C), height 5\' 6"  (1.676 m), weight 196 lb (88.9 kg), SpO2 97 %. Body mass index is 31.64 kg/m.  Lab Results  Component Value Date   CREATININE 0.71 01/19/2022   BUN 24 01/19/2022   NA 142 01/19/2022   K 4.8 01/19/2022   CL 104 01/19/2022   CO2 16 (L) 01/19/2022   Lab Results  Component Value Date   ALT 25 01/19/2022   AST 17 01/19/2022   ALKPHOS 107 01/19/2022   BILITOT 0.3 01/19/2022   Lab Results  Component Value Date   HGBA1C 5.3 01/19/2022   HGBA1C 5.2 08/10/2021   HGBA1C 5.5 10/15/2020   HGBA1C 5.2 10/31/2018   Lab Results  Component Value Date   INSULIN 33.8 (H) 08/10/2021   INSULIN 29.3 (H) 10/15/2020   INSULIN 30.9 (H) 10/31/2018   Lab Results  Component Value Date   TSH 2.220 08/10/2021   Lab Results  Component Value Date   CHOL 165  08/10/2021   HDL 51 08/10/2021   LDLCALC 99 08/10/2021   TRIG 79 08/10/2021   Lab Results  Component Value Date   VD25OH 64.9 01/19/2022   VD25OH 91.7 08/10/2021   VD25OH 84.2 10/15/2020   Lab Results  Component Value Date   WBC 8.0 10/15/2020   HGB 15.4 10/15/2020   HCT 44.8 10/15/2020   MCV 95 10/15/2020   PLT 267 10/15/2020   No results found for: "IRON", "TIBC", "FERRITIN"  Attestation Statements:   Reviewed by clinician on day of visit: allergies, medications,  problem list, medical history, surgical history, family history, social history, and previous encounter notes.   I, Trixie Dredge, am acting as transcriptionist for Dennard Nip, MD.  I have reviewed the above documentation for accuracy and completeness, and I agree with the above. -  Dennard Nip, MD

## 2022-12-28 ENCOUNTER — Ambulatory Visit (INDEPENDENT_AMBULATORY_CARE_PROVIDER_SITE_OTHER): Payer: 59 | Admitting: Family Medicine

## 2023-01-11 ENCOUNTER — Other Ambulatory Visit (HOSPITAL_COMMUNITY): Payer: Self-pay

## 2023-01-11 ENCOUNTER — Encounter (INDEPENDENT_AMBULATORY_CARE_PROVIDER_SITE_OTHER): Payer: Self-pay | Admitting: Family Medicine

## 2023-01-11 ENCOUNTER — Ambulatory Visit (INDEPENDENT_AMBULATORY_CARE_PROVIDER_SITE_OTHER): Payer: 59 | Admitting: Family Medicine

## 2023-01-11 VITALS — BP 137/88 | HR 78 | Temp 97.8°F | Ht 66.0 in | Wt 200.0 lb

## 2023-01-11 DIAGNOSIS — Z6832 Body mass index (BMI) 32.0-32.9, adult: Secondary | ICD-10-CM | POA: Diagnosis not present

## 2023-01-11 DIAGNOSIS — R7303 Prediabetes: Secondary | ICD-10-CM

## 2023-01-11 DIAGNOSIS — E669 Obesity, unspecified: Secondary | ICD-10-CM

## 2023-01-11 MED ORDER — METFORMIN HCL 1000 MG PO TABS
1000.0000 mg | ORAL_TABLET | Freq: Two times a day (BID) | ORAL | 0 refills | Status: AC
  Filled 2023-01-11: qty 60, 30d supply, fill #0

## 2023-01-11 NOTE — Progress Notes (Signed)
Chief Complaint:   OBESITY Heather Morales is here to discuss her progress with her obesity treatment plan along with follow-up of her obesity related diagnoses. [name] is on keeping a food journal and adhering to recommended goals of 1200 calories and 80+ grams of protein and states she is following her eating plan approximately 50% of the time. Syndey states she is doing 0 minutes 0 times per week.  Today's visit was #: 67 Starting weight: 225 lbs Starting date: 10/15/2020 Today's weight: 200 lbs Today's date: 01/11/2023 Total lbs lost to date: 25 Total lbs lost since last in-office visit: 0  Interim History: Emmi has been snacking a bit more, and not journaling as much. She hasn't been drinking as much water, but just retired this week and hasn't gotten into her new routine just yet.   Subjective:   1. Prediabetes Heather Morales is doing well on metformin, but she still notes PM polyphagia.   Assessment/Plan:   1. Prediabetes [name] agreed to increase metformin to 1,000 mg BID, (patient is to take 1/2 dose in the afternoon for now). She will continue with her diet and exercise.   - metFORMIN (GLUCOPHAGE) 1000 MG tablet; Take 1 tablet (1,000 mg total) by mouth 2 (two) times daily with a meal.  Dispense: 60 tablet; Refill: 0  2. BMI 32.0-32.9,adult  3. Obesity, Beginning BMI 34.54 Heather Morales is currently in the action stage of change. As such, her goal is to continue with weight loss efforts. She has agreed to the Category 2 Plan or the [place].   She will work into getting into a new routine.   Exercise goals: All adults should avoid inactivity. Some physical activity is better than none, and adults who participate in any amount of physical activity gain some health benefits.  Behavioral modification strategies: increasing lean protein intake.  Heather Morales has agreed to follow-up with our clinic in 4 weeks. She was informed of the importance of frequent follow-up visits to maximize her  success with intensive lifestyle modifications for her multiple health conditions.   Objective:   Blood pressure 137/88, pulse 78, temperature 97.8 F (36.6 C), height 5\' 6"  (1.676 m), weight 200 lb (90.7 kg), SpO2 96 %. Body mass index is 32.28 kg/m.  Lab Results  Component Value Date   CREATININE 0.71 01/19/2022   BUN 24 01/19/2022   NA 142 01/19/2022   K 4.8 01/19/2022   CL 104 01/19/2022   CO2 16 (L) 01/19/2022   Lab Results  Component Value Date   ALT 25 01/19/2022   AST 17 01/19/2022   ALKPHOS 107 01/19/2022   BILITOT 0.3 01/19/2022   Lab Results  Component Value Date   HGBA1C 5.3 01/19/2022   HGBA1C 5.2 08/10/2021   HGBA1C 5.5 10/15/2020   HGBA1C 5.2 10/31/2018   Lab Results  Component Value Date   INSULIN 33.8 (H) 08/10/2021   INSULIN 29.3 (H) 10/15/2020   INSULIN 30.9 (H) 10/31/2018   Lab Results  Component Value Date   TSH 2.220 08/10/2021   Lab Results  Component Value Date   CHOL 165 08/10/2021   HDL 51 08/10/2021   LDLCALC 99 08/10/2021   TRIG 79 08/10/2021   Lab Results  Component Value Date   VD25OH 64.9 01/19/2022   VD25OH 91.7 08/10/2021   VD25OH 84.2 10/15/2020   Lab Results  Component Value Date   WBC 8.0 10/15/2020   HGB 15.4 10/15/2020   HCT 44.8 10/15/2020   MCV 95 10/15/2020  PLT 267 10/15/2020   No results found for: "IRON", "TIBC", "FERRITIN"  Attestation Statements:   Reviewed by clinician on day of visit: allergies, medications, problem list, medical history, surgical history, family history, social history, and previous encounter notes.   I, Trixie Dredge, am acting as transcriptionist for Dennard Nip, MD.  I have reviewed the above documentation for accuracy and completeness, and I agree with the above. -  Dennard Nip, MD

## 2023-02-08 ENCOUNTER — Ambulatory Visit (INDEPENDENT_AMBULATORY_CARE_PROVIDER_SITE_OTHER): Payer: 59 | Admitting: Family Medicine

## 2023-05-02 ENCOUNTER — Other Ambulatory Visit (HOSPITAL_COMMUNITY): Payer: Self-pay

## 2023-05-02 MED ORDER — EPINEPHRINE 0.3 MG/0.3ML IJ SOAJ
0.3000 mg | INTRAMUSCULAR | 1 refills | Status: AC | PRN
  Filled 2023-05-02: qty 2, 2d supply, fill #0

## 2023-05-08 ENCOUNTER — Other Ambulatory Visit (HOSPITAL_COMMUNITY): Payer: Self-pay

## 2023-07-31 ENCOUNTER — Other Ambulatory Visit (HOSPITAL_COMMUNITY): Payer: Self-pay

## 2023-07-31 MED ORDER — BUDESONIDE-FORMOTEROL FUMARATE 80-4.5 MCG/ACT IN AERO
2.0000 | INHALATION_SPRAY | Freq: Every day | RESPIRATORY_TRACT | 5 refills | Status: AC
  Filled 2023-07-31: qty 10.2, 30d supply, fill #0

## 2023-08-01 ENCOUNTER — Other Ambulatory Visit (HOSPITAL_COMMUNITY): Payer: Self-pay

## 2023-09-06 ENCOUNTER — Other Ambulatory Visit (HOSPITAL_COMMUNITY): Payer: Self-pay

## 2023-09-06 MED ORDER — BUDESONIDE-FORMOTEROL FUMARATE 80-4.5 MCG/ACT IN AERO
12.0000 | INHALATION_SPRAY | Freq: Every day | RESPIRATORY_TRACT | 0 refills | Status: AC
  Filled 2023-09-06: qty 10.2, 5d supply, fill #0

## 2023-11-16 ENCOUNTER — Other Ambulatory Visit (HOSPITAL_COMMUNITY): Payer: Self-pay

## 2023-11-16 ENCOUNTER — Other Ambulatory Visit: Payer: Self-pay

## 2023-11-16 MED ORDER — VALSARTAN 80 MG PO TABS
80.0000 mg | ORAL_TABLET | Freq: Every day | ORAL | 1 refills | Status: AC
  Filled 2023-11-16: qty 30, 30d supply, fill #0

## 2023-11-16 MED ORDER — HYDROCODONE-ACETAMINOPHEN 5-325 MG PO TABS
1.0000 | ORAL_TABLET | Freq: Two times a day (BID) | ORAL | 0 refills | Status: AC | PRN
  Filled 2023-11-16: qty 30, 15d supply, fill #0

## 2023-11-22 ENCOUNTER — Other Ambulatory Visit: Payer: Self-pay | Admitting: Medical Genetics

## 2024-08-02 ENCOUNTER — Other Ambulatory Visit: Payer: Self-pay | Admitting: Medical Genetics

## 2024-08-02 DIAGNOSIS — Z006 Encounter for examination for normal comparison and control in clinical research program: Secondary | ICD-10-CM

## 2024-08-07 ENCOUNTER — Other Ambulatory Visit (HOSPITAL_COMMUNITY): Payer: Self-pay

## 2024-08-07 MED ORDER — GABAPENTIN 300 MG PO CAPS
300.0000 mg | ORAL_CAPSULE | ORAL | 0 refills | Status: AC
  Filled 2024-08-07: qty 2, 1d supply, fill #0

## 2024-08-07 MED ORDER — CELECOXIB 200 MG PO CAPS
200.0000 mg | ORAL_CAPSULE | ORAL | 0 refills | Status: AC
  Filled 2024-08-07: qty 2, 1d supply, fill #0

## 2024-08-07 MED ORDER — CEPHALEXIN 500 MG PO CAPS
500.0000 mg | ORAL_CAPSULE | Freq: Three times a day (TID) | ORAL | 0 refills | Status: AC
  Filled 2024-08-07: qty 21, 7d supply, fill #0

## 2024-08-07 MED ORDER — ONDANSETRON 4 MG PO TBDP
4.0000 mg | ORAL_TABLET | Freq: Four times a day (QID) | ORAL | 0 refills | Status: AC | PRN
  Filled 2024-08-07: qty 10, 2d supply, fill #0

## 2024-08-07 MED ORDER — OXYCODONE-ACETAMINOPHEN 5-325 MG PO TABS
1.0000 | ORAL_TABLET | Freq: Four times a day (QID) | ORAL | 0 refills | Status: AC | PRN
  Filled 2024-08-07: qty 18, 4d supply, fill #0

## 2024-09-06 LAB — GENECONNECT MOLECULAR SCREEN: Genetic Analysis Overall Interpretation: NEGATIVE

## 2024-10-17 ENCOUNTER — Other Ambulatory Visit (HOSPITAL_COMMUNITY): Payer: Self-pay

## 2024-10-17 MED ORDER — MELOXICAM 7.5 MG PO TABS
7.5000 mg | ORAL_TABLET | Freq: Every day | ORAL | 0 refills | Status: AC
  Filled 2024-10-17: qty 30, 30d supply, fill #0

## 2024-11-13 ENCOUNTER — Other Ambulatory Visit (HOSPITAL_COMMUNITY): Payer: Self-pay

## 2024-11-13 MED ORDER — ESTRING 7.5 MCG/24HR VA RING
VAGINAL_RING | VAGINAL | 3 refills | Status: AC
  Filled 2024-11-13: qty 1, 90d supply, fill #0

## 2024-11-14 ENCOUNTER — Other Ambulatory Visit (HOSPITAL_COMMUNITY): Payer: Self-pay
